# Patient Record
Sex: Female | Born: 1935 | Race: White | Hispanic: No | Marital: Married | Smoking: Never smoker
Health system: Southern US, Community
[De-identification: ages and names within clinical notes are randomized; demographics above are authoritative.]

## PROBLEM LIST (undated history)

## (undated) DIAGNOSIS — N289 Disorder of kidney and ureter, unspecified: Secondary | ICD-10-CM

## (undated) DIAGNOSIS — M810 Age-related osteoporosis without current pathological fracture: Secondary | ICD-10-CM

## (undated) HISTORY — PX: BREAST SURGERY: SHX581

## (undated) HISTORY — DX: Age-related osteoporosis without current pathological fracture: M81.0

## (undated) HISTORY — PX: THYROID SURGERY: SHX805

## (undated) HISTORY — PX: HYSTEROSCOPY: SHX211

## (undated) HISTORY — PX: KNEE SURGERY: SHX244

## (undated) HISTORY — DX: Disorder of kidney and ureter, unspecified: N28.9

---

## 1998-08-03 ENCOUNTER — Other Ambulatory Visit: Admission: RE | Admit: 1998-08-03 | Discharge: 1998-08-03 | Payer: Self-pay | Admitting: Gynecology

## 1998-10-14 ENCOUNTER — Other Ambulatory Visit: Admission: RE | Admit: 1998-10-14 | Discharge: 1998-10-14 | Payer: Self-pay | Admitting: Gynecology

## 1998-11-04 ENCOUNTER — Encounter (INDEPENDENT_AMBULATORY_CARE_PROVIDER_SITE_OTHER): Payer: Self-pay

## 1998-11-04 ENCOUNTER — Other Ambulatory Visit: Admission: RE | Admit: 1998-11-04 | Discharge: 1998-11-04 | Payer: Self-pay | Admitting: Gynecology

## 1999-01-13 ENCOUNTER — Encounter: Payer: Self-pay | Admitting: Internal Medicine

## 1999-01-13 ENCOUNTER — Encounter: Admission: RE | Admit: 1999-01-13 | Discharge: 1999-01-13 | Payer: Self-pay | Admitting: Internal Medicine

## 1999-01-21 ENCOUNTER — Encounter: Admission: RE | Admit: 1999-01-21 | Discharge: 1999-01-21 | Payer: Self-pay | Admitting: Internal Medicine

## 1999-01-21 ENCOUNTER — Encounter: Payer: Self-pay | Admitting: Internal Medicine

## 1999-09-08 ENCOUNTER — Encounter: Admission: RE | Admit: 1999-09-08 | Discharge: 1999-09-08 | Payer: Self-pay | Admitting: Gynecology

## 1999-09-08 ENCOUNTER — Encounter: Payer: Self-pay | Admitting: Gynecology

## 2000-02-01 ENCOUNTER — Encounter: Payer: Self-pay | Admitting: Gynecology

## 2000-02-01 ENCOUNTER — Encounter: Admission: RE | Admit: 2000-02-01 | Discharge: 2000-02-01 | Payer: Self-pay | Admitting: Gynecology

## 2000-09-11 ENCOUNTER — Other Ambulatory Visit: Admission: RE | Admit: 2000-09-11 | Discharge: 2000-09-11 | Payer: Self-pay | Admitting: Gynecology

## 2001-02-05 ENCOUNTER — Encounter: Admission: RE | Admit: 2001-02-05 | Discharge: 2001-02-05 | Payer: Self-pay | Admitting: Gynecology

## 2001-02-05 ENCOUNTER — Encounter: Payer: Self-pay | Admitting: Gynecology

## 2001-05-23 ENCOUNTER — Encounter: Admission: RE | Admit: 2001-05-23 | Discharge: 2001-05-23 | Payer: Self-pay | Admitting: *Deleted

## 2001-05-23 ENCOUNTER — Encounter: Payer: Self-pay | Admitting: *Deleted

## 2001-09-12 ENCOUNTER — Other Ambulatory Visit: Admission: RE | Admit: 2001-09-12 | Discharge: 2001-09-12 | Payer: Self-pay | Admitting: Gynecology

## 2002-02-06 ENCOUNTER — Encounter: Payer: Self-pay | Admitting: Gynecology

## 2002-02-06 ENCOUNTER — Encounter: Admission: RE | Admit: 2002-02-06 | Discharge: 2002-02-06 | Payer: Self-pay | Admitting: Gynecology

## 2002-09-17 ENCOUNTER — Other Ambulatory Visit: Admission: RE | Admit: 2002-09-17 | Discharge: 2002-09-17 | Payer: Self-pay | Admitting: Gynecology

## 2002-11-08 ENCOUNTER — Ambulatory Visit (HOSPITAL_COMMUNITY): Admission: RE | Admit: 2002-11-08 | Discharge: 2002-11-08 | Payer: Self-pay | Admitting: Internal Medicine

## 2002-11-08 ENCOUNTER — Encounter: Payer: Self-pay | Admitting: Internal Medicine

## 2003-02-17 ENCOUNTER — Encounter: Admission: RE | Admit: 2003-02-17 | Discharge: 2003-02-17 | Payer: Self-pay | Admitting: Gynecology

## 2003-09-18 ENCOUNTER — Other Ambulatory Visit: Admission: RE | Admit: 2003-09-18 | Discharge: 2003-09-18 | Payer: Self-pay | Admitting: Gynecology

## 2004-03-10 ENCOUNTER — Encounter: Admission: RE | Admit: 2004-03-10 | Discharge: 2004-03-10 | Payer: Self-pay | Admitting: Gynecology

## 2004-09-27 ENCOUNTER — Other Ambulatory Visit: Admission: RE | Admit: 2004-09-27 | Discharge: 2004-09-27 | Payer: Self-pay | Admitting: Gynecology

## 2004-09-28 ENCOUNTER — Ambulatory Visit (HOSPITAL_COMMUNITY): Admission: RE | Admit: 2004-09-28 | Discharge: 2004-09-28 | Payer: Self-pay | Admitting: Gastroenterology

## 2005-03-16 ENCOUNTER — Encounter: Admission: RE | Admit: 2005-03-16 | Discharge: 2005-03-16 | Payer: Self-pay | Admitting: Gynecology

## 2005-11-23 ENCOUNTER — Other Ambulatory Visit: Admission: RE | Admit: 2005-11-23 | Discharge: 2005-11-23 | Payer: Self-pay | Admitting: Gynecology

## 2005-11-23 ENCOUNTER — Encounter: Admission: RE | Admit: 2005-11-23 | Discharge: 2005-12-28 | Payer: Self-pay | Admitting: Orthopedic Surgery

## 2006-04-11 ENCOUNTER — Encounter: Admission: RE | Admit: 2006-04-11 | Discharge: 2006-04-11 | Payer: Self-pay | Admitting: Gynecology

## 2006-08-21 ENCOUNTER — Encounter: Admission: RE | Admit: 2006-08-21 | Discharge: 2006-08-21 | Payer: Self-pay | Admitting: Internal Medicine

## 2006-12-06 ENCOUNTER — Other Ambulatory Visit: Admission: RE | Admit: 2006-12-06 | Discharge: 2006-12-06 | Payer: Self-pay | Admitting: Gynecology

## 2007-04-16 ENCOUNTER — Encounter: Admission: RE | Admit: 2007-04-16 | Discharge: 2007-04-16 | Payer: Self-pay | Admitting: Gynecology

## 2008-04-24 ENCOUNTER — Encounter: Admission: RE | Admit: 2008-04-24 | Discharge: 2008-04-24 | Payer: Self-pay | Admitting: Gynecology

## 2009-04-27 ENCOUNTER — Encounter: Admission: RE | Admit: 2009-04-27 | Discharge: 2009-04-27 | Payer: Self-pay | Admitting: Gynecology

## 2010-07-27 ENCOUNTER — Other Ambulatory Visit: Payer: Self-pay | Admitting: Gynecology

## 2010-07-27 DIAGNOSIS — Z1231 Encounter for screening mammogram for malignant neoplasm of breast: Secondary | ICD-10-CM

## 2010-07-30 ENCOUNTER — Ambulatory Visit
Admission: RE | Admit: 2010-07-30 | Discharge: 2010-07-30 | Disposition: A | Discharge status: Home / Self Care | Payer: Medicare Other | Source: Ambulatory Visit | Attending: Gynecology | Admitting: Gynecology

## 2010-07-30 DIAGNOSIS — Z1231 Encounter for screening mammogram for malignant neoplasm of breast: Secondary | ICD-10-CM

## 2010-11-22 ENCOUNTER — Inpatient Hospital Stay (INDEPENDENT_AMBULATORY_CARE_PROVIDER_SITE_OTHER)
Admission: RE | Admit: 2010-11-22 | Discharge: 2010-11-22 | Disposition: A | Discharge status: Home / Self Care | Payer: Medicare Other | Source: Ambulatory Visit | Attending: Family Medicine | Admitting: Family Medicine

## 2010-11-22 DIAGNOSIS — S058X9A Other injuries of unspecified eye and orbit, initial encounter: Secondary | ICD-10-CM

## 2010-11-27 ENCOUNTER — Emergency Department (HOSPITAL_COMMUNITY)
Admission: EM | Admit: 2010-11-27 | Discharge: 2010-11-27 | Disposition: A | Discharge status: Home / Self Care | Payer: Medicare Other | Attending: Emergency Medicine | Admitting: Emergency Medicine

## 2010-11-27 DIAGNOSIS — S81009A Unspecified open wound, unspecified knee, initial encounter: Secondary | ICD-10-CM | POA: Insufficient documentation

## 2010-11-27 DIAGNOSIS — W268XXA Contact with other sharp object(s), not elsewhere classified, initial encounter: Secondary | ICD-10-CM | POA: Insufficient documentation

## 2010-11-27 DIAGNOSIS — Y92009 Unspecified place in unspecified non-institutional (private) residence as the place of occurrence of the external cause: Secondary | ICD-10-CM | POA: Insufficient documentation

## 2011-06-24 ENCOUNTER — Other Ambulatory Visit: Payer: Self-pay | Admitting: Gynecology

## 2011-06-24 DIAGNOSIS — Z1231 Encounter for screening mammogram for malignant neoplasm of breast: Secondary | ICD-10-CM

## 2011-08-01 ENCOUNTER — Ambulatory Visit
Admission: RE | Admit: 2011-08-01 | Discharge: 2011-08-01 | Disposition: A | Discharge status: Home / Self Care | Payer: Medicare Other | Source: Ambulatory Visit | Attending: Gynecology | Admitting: Gynecology

## 2011-08-01 DIAGNOSIS — Z1231 Encounter for screening mammogram for malignant neoplasm of breast: Secondary | ICD-10-CM

## 2012-09-12 ENCOUNTER — Other Ambulatory Visit: Payer: Self-pay

## 2012-09-12 DIAGNOSIS — Z1231 Encounter for screening mammogram for malignant neoplasm of breast: Secondary | ICD-10-CM

## 2012-09-19 ENCOUNTER — Ambulatory Visit
Admission: RE | Admit: 2012-09-19 | Discharge: 2012-09-19 | Disposition: A | Discharge status: Home / Self Care | Payer: Medicare Other | Source: Ambulatory Visit

## 2012-09-19 DIAGNOSIS — Z1231 Encounter for screening mammogram for malignant neoplasm of breast: Secondary | ICD-10-CM

## 2012-11-15 ENCOUNTER — Ambulatory Visit
Admission: RE | Admit: 2012-11-15 | Discharge: 2012-11-15 | Disposition: A | Discharge status: Home / Self Care | Payer: Medicare Other | Source: Ambulatory Visit | Attending: Internal Medicine | Admitting: Internal Medicine

## 2012-11-15 ENCOUNTER — Other Ambulatory Visit: Payer: Self-pay | Admitting: Internal Medicine

## 2012-11-15 DIAGNOSIS — M25561 Pain in right knee: Secondary | ICD-10-CM

## 2013-03-07 ENCOUNTER — Encounter: Payer: Self-pay | Admitting: Gynecology

## 2013-03-07 ENCOUNTER — Ambulatory Visit (INDEPENDENT_AMBULATORY_CARE_PROVIDER_SITE_OTHER): Payer: Medicare Other | Admitting: Gynecology

## 2013-03-07 VITALS — BP 120/76 | Ht 64.0 in | Wt 153.0 lb

## 2013-03-07 DIAGNOSIS — M8580 Other specified disorders of bone density and structure, unspecified site: Secondary | ICD-10-CM

## 2013-03-07 DIAGNOSIS — N952 Postmenopausal atrophic vaginitis: Secondary | ICD-10-CM

## 2013-03-07 DIAGNOSIS — N9489 Other specified conditions associated with female genital organs and menstrual cycle: Secondary | ICD-10-CM

## 2013-03-07 DIAGNOSIS — N8111 Cystocele, midline: Secondary | ICD-10-CM

## 2013-03-07 DIAGNOSIS — N898 Other specified noninflammatory disorders of vagina: Secondary | ICD-10-CM

## 2013-03-07 DIAGNOSIS — M899 Disorder of bone, unspecified: Secondary | ICD-10-CM

## 2013-03-07 DIAGNOSIS — N816 Rectocele: Secondary | ICD-10-CM

## 2013-03-07 DIAGNOSIS — K644 Residual hemorrhoidal skin tags: Secondary | ICD-10-CM

## 2013-03-07 MED ORDER — ESTRADIOL 0.1 MG/GM VA CREA: TOPICAL_CREAM | VAGINAL | Status: DC

## 2013-03-07 NOTE — Progress Notes (Signed)
Courtney Holland Sep 08, 1932 [id number]        79 y.o.  G1P0 new patient for followup exam.  Former patient of [name], several issues noted below.  Past medical history,surgical history, problem list, medications, allergies, family history and social history were all reviewed and documented in the EPIC chart.  ROS:  Performed and pertinent positives and negatives are included in the history, assessment and plan .  Exam: Kim assistant Filed Vitals:   03/07/13 1106  BP: 120/76  Height: 5\' 4"  (1.626 m)  Weight: 153 lb (69.4 kg)   General appearance  Normal Skin grossly normal Head/Neck normal with no cervical or supraclavicular adenopathy thyroid normal Lungs  clear Cardiac RR, without RMG Abdominal  soft, nontender, without masses, organomegaly or hernia Breasts  examined lying and sitting without masses, retractions, discharge or axillary adenopathy. Pelvic  Ext/BUS/vagina general atrophic changes. First to second-degree cystocele. First to second-degree rectocele. Cervix well supported.   Cervix  atrophic  Uterus  anteverted, normal size, shape and contour, midline and mobile nontender   Adnexa  Without masses or tenderness    Anus and perineum  Normal   Rectovaginal  Normal sphincter tone without palpated masses or tenderness. External hemorrhoids   Assessment/Plan:  79 y.o. G1P0 female for followup exam.   1. Postmenopausal/atrophic genital changes. Patient without significant hot flushes or night sweats. She does have vaginal dryness and some dyspareunia. Had been using Estrace vaginal cream per [name]. Reviewed issues of vaginal estrogen and potential for absorption. Risks include stroke heart attack DVT breast cancer. Patient's comfortable continuing I refilled her x1 year. Patient knows to report any vaginal bleeding. 2. Cystocele rectocele. Patient does remember [name] mentioning this. Patient is without symptoms such as pressure, fecal trapping or urinary symptoms  such as urgency or incontinence. Will observe at present. Followup if symptoms develop. Options for treatment were reviewed to include pessary and surgery. 3. Osteopenia. DEXA 2010 [name] office she is T score -2.2. Patient has never taken prescription medication for treatment. Is on extra vitamin D. Repeat DEXA now for baseline. Patient agrees to schedule. Increase calcium vitamin D reviewed. 4. External hemorrhoids, old. Patient asymptomatic and will continue to monitor. 5. Mammography 09/2012. Continue with annual mammography. SBE monthly reviewed. 6. Pap smear 2013. No Pap smear done today. No history of significant abnormal Pap smears. Current screening guidelines reviewed. Patient is comfortable stop screening as she is over the age of 46. 7. Colonoscopy 2006. Planned repeat at 10 year interval per her history. 8. Health maintenance. No blood work done as she actively sees Dr. Earl Gala. Followup for bone density otherwise annually.   Note: This document was prepared with digital dictation and possible smart phrase technology. Any transcriptional errors that result from this process are unintentional.   [name], 11:42 AM 03/07/2013

## 2013-03-07 NOTE — Patient Instructions (Signed)
Follow up for bone density as scheduled  Follow up for annual exam in one year 

## 2013-03-08 LAB — URINALYSIS W MICROSCOPIC + REFLEX CULTURE
Bilirubin Urine: NEGATIVE
Crystals: NONE SEEN
Glucose, UA: NEGATIVE mg/dL
Protein, ur: NEGATIVE mg/dL
Squamous Epithelial / LPF: NONE SEEN

## 2013-05-19 DIAGNOSIS — M810 Age-related osteoporosis without current pathological fracture: Secondary | ICD-10-CM | POA: Insufficient documentation

## 2013-05-19 HISTORY — DX: Age-related osteoporosis without current pathological fracture: M81.0

## 2013-05-30 ENCOUNTER — Ambulatory Visit (INDEPENDENT_AMBULATORY_CARE_PROVIDER_SITE_OTHER): Payer: Medicare Other

## 2013-05-30 ENCOUNTER — Telehealth: Payer: Self-pay | Admitting: Gynecology

## 2013-05-30 ENCOUNTER — Encounter: Payer: Self-pay | Admitting: Gynecology

## 2013-05-30 ENCOUNTER — Other Ambulatory Visit: Payer: Self-pay | Admitting: Gynecology

## 2013-05-30 DIAGNOSIS — M8580 Other specified disorders of bone density and structure, unspecified site: Secondary | ICD-10-CM

## 2013-05-30 DIAGNOSIS — M810 Age-related osteoporosis without current pathological fracture: Secondary | ICD-10-CM

## 2013-05-30 NOTE — Telephone Encounter (Signed)
Tell patient that her bone density does show osteoporosis and I recommend office visit to discuss treatment options

## 2013-05-31 NOTE — Telephone Encounter (Signed)
Pt informed with the below note. 

## 2013-06-10 ENCOUNTER — Ambulatory Visit: Payer: Medicare Other | Admitting: Gynecology

## 2013-06-20 ENCOUNTER — Ambulatory Visit: Payer: Medicare Other | Admitting: Gynecology

## 2013-07-09 ENCOUNTER — Ambulatory Visit: Payer: Medicare Other | Admitting: Gynecology

## 2013-07-16 ENCOUNTER — Encounter: Payer: Self-pay | Admitting: Gynecology

## 2013-07-16 ENCOUNTER — Ambulatory Visit (INDEPENDENT_AMBULATORY_CARE_PROVIDER_SITE_OTHER): Payer: Medicare Other | Admitting: Gynecology

## 2013-07-16 DIAGNOSIS — M810 Age-related osteoporosis without current pathological fracture: Secondary | ICD-10-CM

## 2013-07-16 NOTE — Progress Notes (Signed)
Courtney Holland Jun 25, 1932 [id number]        80 y.o.  G1P0 presents to discuss her bone density. Prior DEXA at Dr. Nicholas Holland office showed a T score -2.2. DEXA 05/2013 showed T score -2.5 right femoral neck. No history of prior treatment.  Past medical history,surgical history, problem list, medications, allergies, family history and social history were all reviewed and documented in the EPIC chart.  Directed ROS with pertinent positives and negatives documented in the history of present illness/assessment and plan.   Assessment/Plan:  80 y.o. G1P0 reviewed osteoporosis with the patient and risk of fracture. No history of prior treatment. Treatment options reviewed. My recommendation would be to consider starting Fosamax 70 mg weekly. How to take the medication and the side effects/risks reviewed to include GERD, osteonecrosis of the jaw, atypical fractures. Patient does relate having a history of renal disease most recently exacerbated by dehydration. Has a followup appointment with her nephrologist this coming month. I discussed with her that I would like to review the use of Fosamax in renal disease to see if contraindicated/lower dose recommended. Regardless I want her to discuss this with her nephrologist and get their approval before starting. Will get back with the patient in reference to this but she would be amenable to starting if we decide to do so.   Note: This document was prepared with digital dictation and possible smart phrase technology. Any transcriptional errors that result from this process are unintentional.   Courtney Lordsimothy P Kainoah Bartosiewicz MD, 12:28 PM 07/16/2013

## 2013-07-16 NOTE — Patient Instructions (Signed)
Office will contact you in reference to starting Fosamax. Recommend discussing this also with your kidney doctor.

## 2014-01-20 ENCOUNTER — Encounter: Payer: Self-pay | Admitting: Gynecology

## 2014-02-19 ENCOUNTER — Other Ambulatory Visit: Payer: Self-pay | Admitting: Internal Medicine

## 2014-02-19 DIAGNOSIS — Z1231 Encounter for screening mammogram for malignant neoplasm of breast: Secondary | ICD-10-CM

## 2014-03-10 ENCOUNTER — Encounter: Payer: Medicare Other | Admitting: Gynecology

## 2014-03-19 ENCOUNTER — Ambulatory Visit
Admission: RE | Admit: 2014-03-19 | Discharge: 2014-03-19 | Disposition: A | Discharge status: Home / Self Care | Payer: Medicare Other | Source: Ambulatory Visit | Attending: Internal Medicine | Admitting: Internal Medicine

## 2014-03-19 DIAGNOSIS — Z1231 Encounter for screening mammogram for malignant neoplasm of breast: Secondary | ICD-10-CM

## 2014-03-26 ENCOUNTER — Ambulatory Visit (INDEPENDENT_AMBULATORY_CARE_PROVIDER_SITE_OTHER): Payer: Medicare Other | Admitting: Gynecology

## 2014-03-26 ENCOUNTER — Encounter: Payer: Self-pay | Admitting: Gynecology

## 2014-03-26 VITALS — BP 136/80 | Ht 65.0 in | Wt 146.0 lb

## 2014-03-26 DIAGNOSIS — Z01419 Encounter for gynecological examination (general) (routine) without abnormal findings: Secondary | ICD-10-CM

## 2014-03-26 DIAGNOSIS — N8111 Cystocele, midline: Secondary | ICD-10-CM

## 2014-03-26 DIAGNOSIS — M810 Age-related osteoporosis without current pathological fracture: Secondary | ICD-10-CM

## 2014-03-26 DIAGNOSIS — N816 Rectocele: Secondary | ICD-10-CM

## 2014-03-26 DIAGNOSIS — N952 Postmenopausal atrophic vaginitis: Secondary | ICD-10-CM

## 2014-03-26 MED ORDER — ALENDRONATE SODIUM 70 MG PO TABS: 70.0000 mg | ORAL_TABLET | ORAL | Status: AC

## 2014-03-26 NOTE — Patient Instructions (Addendum)
Kegel Exercises The goal of Kegel exercises is to isolate and exercise your pelvic floor muscles. These muscles act as a hammock that supports the rectum, vagina, small intestine, and uterus. As the muscles weaken, the hammock sags and these organs are displaced from their normal positions. Kegel exercises can strengthen your pelvic floor muscles and help you to improve bladder and bowel control, improve sexual response, and help reduce many problems and some discomfort during pregnancy. Kegel exercises can be done anywhere and at any time. HOW TO PERFORM KEGEL EXERCISES 1. Locate your pelvic floor muscles. To do this, squeeze (contract) the muscles that you use when you try to stop the flow of urine. You will feel a tightness in the vaginal area (women) and a tight lift in the rectal area (men and women). 2. When you begin, contract your pelvic muscles tight for 2-5 seconds, then relax them for 2-5 seconds. This is one set. Do 4-5 sets with a short pause in between. 3. Contract your pelvic muscles for 8-10 seconds, then relax them for 8-10 seconds. Do 4-5 sets. If you cannot contract your pelvic muscles for 8-10 seconds, try 5-7 seconds and work your way up to 8-10 seconds. Your goal is 4-5 sets of 10 contractions each day. Keep your stomach, buttocks, and legs relaxed during the exercises. Perform sets of both short and long contractions. Vary your positions. Perform these contractions 3-4 times per day. Perform sets while you are:   Lying in bed in the morning.  Standing at lunch.  Sitting in the late afternoon.  Lying in bed at night. You should do 40-50 contractions per day. Do not perform more Kegel exercises per day than recommended. Overexercising can cause muscle fatigue. Continue these exercises for for at least 15-20 weeks or as directed by your caregiver. Document Released: 02/22/2012 Document Reviewed: 02/22/2012 [facility] Patient Information 2015 [place], [place]. This information is  not intended to replace advice given to you by your health care provider. Make sure you discuss any questions you have with your health care provider.          Start on the Fosamax as we discussed. Call me if you have any issues with this.  Alendronate tablets What is this medicine? ALENDRONATE (a LEN droe nate) slows calcium loss from bones. It helps to make normal healthy bone and to slow bone loss in people with Paget's disease and osteoporosis. It may be used in others at risk for bone loss. This medicine may be used for other purposes; ask your health care provider or pharmacist if you have questions. COMMON BRAND NAME(S): Fosamax What should I tell my health care provider before I take this medicine? They need to know if you have any of these conditions: -dental disease -esophagus, stomach, or intestine problems, like acid reflux or GERD -kidney disease -low blood calcium -low vitamin D -problems sitting or standing 30 minutes -trouble swallowing -an unusual or allergic reaction to alendronate, other medicines, foods, dyes, or preservatives -pregnant or trying to get pregnant -breast-feeding How should I use this medicine? You must take this medicine exactly as directed or you will lower the amount of the medicine you absorb into your body or you may cause yourself harm. Take this medicine by mouth first thing in the morning, after you are up for the day. Do not eat or drink anything before you take your medicine. Swallow the tablet with a full glass (6 to 8 fluid ounces) of plain water. Do not take  this medicine with any other drink. Do not chew or crush the tablet. After taking this medicine, do not eat breakfast, drink, or take any medicines or vitamins for at least 30 minutes. Sit or stand up for at least 30 minutes after you take this medicine; do not lie down. Do not take your medicine more often than directed. Talk to your pediatrician regarding the use of this medicine in  children. Special care may be needed. Overdosage: If you think you have taken too much of this medicine contact a poison control center or emergency room at once. NOTE: This medicine is only for you. Do not share this medicine with others. What if I miss a dose? If you miss a dose, do not take it later in the day. Continue your normal schedule starting the next morning. Do not take double or extra doses. What may interact with this medicine? -aluminum hydroxide -antacids -aspirin -calcium supplements -drugs for inflammation like ibuprofen, naproxen, and others -iron supplements -magnesium supplements -vitamins with minerals This list may not describe all possible interactions. Give your health care provider a list of all the medicines, herbs, non-prescription drugs, or dietary supplements you use. Also tell them if you smoke, drink alcohol, or use illegal drugs. Some items may interact with your medicine. What should I watch for while using this medicine? Visit your doctor or health care professional for regular checks ups. It may be some time before you see benefit from this medicine. Do not stop taking your medicine except on your doctor's advice. Your doctor or health care professional may order blood tests and other tests to see how you are doing. You should make sure you get enough calcium and vitamin D while you are taking this medicine, unless your doctor tells you not to. Discuss the foods you eat and the vitamins you take with your health care professional. Some people who take this medicine have severe bone, joint, and/or muscle pain. This medicine may also increase your risk for a broken thigh bone. Tell your doctor right away if you have pain in your upper leg or groin. Tell your doctor if you have any pain that does not go away or that gets worse. This medicine can make you more sensitive to the sun. If you get a rash while taking this medicine, sunlight may cause the rash to get  worse. Keep out of the sun. If you cannot avoid being in the sun, wear protective clothing and use sunscreen. Do not use sun lamps or tanning beds/booths. What side effects may I notice from receiving this medicine? Side effects that you should report to your doctor or health care professional as soon as possible: -allergic reactions like skin rash, itching or hives, swelling of the face, lips, or tongue -black or tarry stools -bone, muscle or joint pain -changes in vision -chest pain -heartburn or stomach pain -jaw pain, especially after dental work -pain or trouble when swallowing -redness, blistering, peeling or loosening of the skin, including inside the mouth Side effects that usually do not require medical attention (report to your doctor or health care professional if they continue or are bothersome): -changes in taste -diarrhea or constipation -eye pain or itching -headache -nausea or vomiting -stomach gas or fullness This list may not describe all possible side effects. Call your doctor for medical advice about side effects. You may report side effects to FDA at 1-800-FDA-1088. Where should I keep my medicine? Keep out of the reach of children. Store at  room temperature of 15 and 30 degrees C (59 and 86 degrees F). Throw away any unused medicine after the expiration date. NOTE: This sheet is a summary. It may not cover all possible information. If you have questions about this medicine, talk to your doctor, pharmacist, or health care provider.  2015, [employer]. (2010-09-03 08:56:09)

## 2014-03-26 NOTE — Progress Notes (Signed)
Larey SeatMarlene F Frison 1932/04/06 [id number]        81 y.o.  G1P0 for breast and pelvic exam. Several issues noted below.  Past medical history,surgical history, problem list, medications, allergies, family history and social history were all reviewed and documented as reviewed in the EPIC chart.  ROS:  Performed with pertinent positives and negatives included in the history, assessment and plan.   Additional significant findings :   None   Exam: [facility] personassistant Filed Vitals:   03/26/14 0758  BP: 136/80  Height: 5\' 5"  (1.651 m)  Weight: 146 lb (66.225 kg)   General appearance:  Normal affect, orientation and appearance. Skin: Grossly normal HEENT: Without gross lesions.  No cervical or supraclavicular adenopathy. Thyroid normal.  Lungs:  Clear without wheezing, rales or rhonchi Cardiac: RR, without RMG Abdominal:  Soft, nontender, without masses, guarding, rebound, organomegaly or hernia Breasts:  Examined lying and sitting without masses, retractions, discharge or axillary adenopathy. Pelvic:  Ext/BUS/vagina with generalized atrophic changes. First degree cystocele. First to second-degree rectocele. Uterus appears well supported.  Cervix with atrophic changes  Uterus anteverted, normal size, shape and contour, midline and mobile nontender   Adnexa  Without masses or tenderness    Anus and perineum  Normal   Rectovaginal  Normal sphincter tone without palpated masses or tenderness.    Assessment/Plan:  81 y.o. G1P0 female for breast and pelvic exam.   1. Postmenopausal/atrophic genital changes. Without significant symptoms of hot flashes, night sweats, vaginal dryness or dyspareunia. No vaginal bleeding. Continue to monitor. Report any vaginal bleeding. 2. Cystocele/rectocele. Does note some bulging after bowel movement. Not overly bothersome to the patient. Options for management reviewed to include Kegel exercises, pessary, surgery. Patient wants to try the Kegel exercises. Will follow  up if she wants to pursue alternative treatments. 3. Osteoporosis. DEXA 05/2013 T score -2.5. I discussed Fosamax previously with her. I was under the impression she was going to discuss this with her renal doctor but this did not happen. Brought in recent lab work which shows a normal BUN and creatinine as well as a normal calcium. Will go ahead and initiate Fosamax 70 mg weekly.  I again reviewed the side effects and risks to include GERD, osteonecrosis of the jaw atypical fractures. Patient will call me if she has any issues starting this and I also reviewed how to take the medication.  Symptoms she does well then we'll repeat her bone density in 2 years. She is supplementing calcium and vitamin D. 4. Mammography 02/2014. Continue with annual mammography. SBE monthly reviewed. 5. Pap smear 2013. No Pap smear done today. No history of significant abnormal Pap smears. We discussed current screening guidelines are both comfortable with stop screening. 6. Colonoscopy being arranged now. 7. Health maintenance. No routine blood work done as patient has had this all done recently. Follow up 1 year, sooner as needed.     Dara LordsFONTAINE,Mitul Hallowell P MD, 8:22 AM 03/26/2014

## 2014-10-21 ENCOUNTER — Ambulatory Visit
Admission: RE | Admit: 2014-10-21 | Discharge: 2014-10-21 | Disposition: A | Discharge status: Home / Self Care | Payer: Medicare Other | Source: Ambulatory Visit | Attending: Nurse Practitioner | Admitting: Nurse Practitioner

## 2014-10-21 ENCOUNTER — Other Ambulatory Visit: Payer: Self-pay | Admitting: Nurse Practitioner

## 2014-10-21 DIAGNOSIS — R509 Fever, unspecified: Secondary | ICD-10-CM

## 2014-10-25 ENCOUNTER — Emergency Department (HOSPITAL_COMMUNITY): Payer: Medicare Other

## 2014-10-25 ENCOUNTER — Emergency Department (HOSPITAL_COMMUNITY)
Admission: EM | Admit: 2014-10-25 | Discharge: 2014-10-25 | Disposition: A | Discharge status: Home / Self Care | Payer: Medicare Other | Attending: Emergency Medicine | Admitting: Emergency Medicine

## 2014-10-25 ENCOUNTER — Encounter (HOSPITAL_COMMUNITY): Payer: Self-pay | Admitting: Emergency Medicine

## 2014-10-25 DIAGNOSIS — Z792 Long term (current) use of antibiotics: Secondary | ICD-10-CM | POA: Insufficient documentation

## 2014-10-25 DIAGNOSIS — Z87448 Personal history of other diseases of urinary system: Secondary | ICD-10-CM | POA: Insufficient documentation

## 2014-10-25 DIAGNOSIS — X58XXXA Exposure to other specified factors, initial encounter: Secondary | ICD-10-CM | POA: Insufficient documentation

## 2014-10-25 DIAGNOSIS — Z79899 Other long term (current) drug therapy: Secondary | ICD-10-CM | POA: Insufficient documentation

## 2014-10-25 DIAGNOSIS — Y9289 Other specified places as the place of occurrence of the external cause: Secondary | ICD-10-CM | POA: Diagnosis not present

## 2014-10-25 DIAGNOSIS — Y939 Activity, unspecified: Secondary | ICD-10-CM | POA: Insufficient documentation

## 2014-10-25 DIAGNOSIS — M810 Age-related osteoporosis without current pathological fracture: Secondary | ICD-10-CM | POA: Insufficient documentation

## 2014-10-25 DIAGNOSIS — Z7982 Long term (current) use of aspirin: Secondary | ICD-10-CM | POA: Diagnosis not present

## 2014-10-25 DIAGNOSIS — Z791 Long term (current) use of non-steroidal anti-inflammatories (NSAID): Secondary | ICD-10-CM | POA: Diagnosis not present

## 2014-10-25 DIAGNOSIS — T368X1A Poisoning by other systemic antibiotics, accidental (unintentional), initial encounter: Secondary | ICD-10-CM | POA: Diagnosis present

## 2014-10-25 DIAGNOSIS — J189 Pneumonia, unspecified organism: Secondary | ICD-10-CM

## 2014-10-25 DIAGNOSIS — T50901A Poisoning by unspecified drugs, medicaments and biological substances, accidental (unintentional), initial encounter: Secondary | ICD-10-CM

## 2014-10-25 DIAGNOSIS — Y998 Other external cause status: Secondary | ICD-10-CM | POA: Insufficient documentation

## 2014-10-25 DIAGNOSIS — J159 Unspecified bacterial pneumonia: Secondary | ICD-10-CM | POA: Insufficient documentation

## 2014-10-25 LAB — CBC WITH DIFFERENTIAL/PLATELET
BASOS PCT: 2 % — AB (ref 0–1)
Basophils Absolute: 0.1 10*3/uL (ref 0.0–0.1)
EOS ABS: 0.1 10*3/uL (ref 0.0–0.7)
EOS PCT: 3 % (ref 0–5)
HEMATOCRIT: 32.7 % — AB (ref 36.0–46.0)
HEMOGLOBIN: 11.1 g/dL — AB (ref 12.0–15.0)
Lymphocytes Relative: 31 % (ref 12–46)
Lymphs Abs: 1.1 10*3/uL (ref 0.7–4.0)
MCH: 31.3 pg (ref 26.0–34.0)
MCHC: 33.9 g/dL (ref 30.0–36.0)
MCV: 92.1 fL (ref 78.0–100.0)
Monocytes Absolute: 0.5 10*3/uL (ref 0.1–1.0)
Monocytes Relative: 15 % — ABNORMAL HIGH (ref 3–12)
Neutro Abs: 1.7 10*3/uL (ref 1.7–7.7)
Neutrophils Relative %: 49 % (ref 43–77)
Platelets: 193 10*3/uL (ref 150–400)
RBC: 3.55 MIL/uL — ABNORMAL LOW (ref 3.87–5.11)
RDW: 13.3 % (ref 11.5–15.5)
WBC: 3.4 10*3/uL — AB (ref 4.0–10.5)

## 2014-10-25 LAB — BASIC METABOLIC PANEL
Anion gap: 11 (ref 5–15)
BUN: 12 mg/dL (ref 6–20)
CALCIUM: 9 mg/dL (ref 8.9–10.3)
CO2: 24 mmol/L (ref 22–32)
Chloride: 102 mmol/L (ref 101–111)
Creatinine, Ser: 1.09 mg/dL — ABNORMAL HIGH (ref 0.44–1.00)
GFR calc non Af Amer: 47 mL/min — ABNORMAL LOW (ref >60)
GFR, EST AFRICAN AMERICAN: 54 mL/min — AB (ref >60)
GLUCOSE: 97 mg/dL (ref 65–99)
Potassium: 4.6 mmol/L (ref 3.5–5.1)
SODIUM: 137 mmol/L (ref 135–145)

## 2014-10-25 MED ORDER — IOHEXOL 300 MG/ML  SOLN
80.0000 mL | Freq: Once | INTRAMUSCULAR | Status: AC | PRN
  Administered 2014-10-25: 80 mL via INTRAVENOUS

## 2014-10-25 MED ORDER — DOXYCYCLINE HYCLATE 100 MG PO CAPS: 100.0000 mg | ORAL_CAPSULE | Freq: Two times a day (BID) | ORAL | Status: AC

## 2014-10-25 MED ORDER — SODIUM CHLORIDE 0.9 % IV BOLUS (SEPSIS)
500.0000 mL | Freq: Once | INTRAVENOUS | Status: AC
  Administered 2014-10-25: 500 mL via INTRAVENOUS

## 2014-10-25 NOTE — ED Notes (Signed)
Pt returns from CT.

## 2014-10-25 NOTE — ED Notes (Signed)
Phlebotomy at bedside.

## 2014-10-25 NOTE — ED Notes (Addendum)
Pt comes from home with c/o dryness of the mouth from taking too many antibiotics. Pt had been getting treated for pneumonia for the past week and has been on Levaquin. Pt took  of Levaquin at 6pm and  at 1230am. Pt thought she was taking tylenol, but she mistakenly took  of Levaquin. Pt denies chest pain, dizziness, weakness or SOB.

## 2014-10-25 NOTE — ED Provider Notes (Signed)
CSN: [id number]     Arrival date & time 10/25/14  0746 History   First MD Initiated Contact with Patient 10/25/14 [phone]     Chief Complaint  Patient presents with  . Ingestion     The history is provided by the patient. No language interpreter was used.   [name] presents for evaluation of accidental overdose.  She has been on levaquin for pneumonia since Tuesday, 500mg  po daily.  She took a dose on Friday at 6pm.  This morning at 1230 am she meant to take two tylenol but accidentally took two levaquin tablets.  She did not realize that until this morning.  She endorses feeling a dry mouth and feeling dehydration.  No chest pain, cough.  Fevers resolved two days ago.  Sxs are moderate and constant.    Past Medical History  Diagnosis Date  . Kidney disease   . Osteoporosis 05/2013     T score -2.5 right femoral neck   Past Surgical History  Procedure Laterality Date  . Thyroid surgery    . Breast surgery      Benign breast lump  . Hysteroscopy    . Knee surgery      Arthroscopic   Family History  Problem Relation Age of Onset  . Cancer Brother     Lung  . Cancer Maternal Grandmother     Colon cancer  . Cancer Son     Lung  . Alzheimer's disease Mother   . Alzheimer's disease Sister    History  Substance Use Topics  . Smoking status: Never Smoker   . Smokeless tobacco: Not on file  . Alcohol Use: No   OB History    Gravida Para Term Preterm AB TAB SAB Ectopic Multiple Living   1         0     Review of Systems  All other systems reviewed and are negative.     Allergies  Adhesive; Oxycodone; and Polysporin  Home Medications   Prior to Admission medications   Medication Sig Start Date End Date Taking? Authorizing Provider  acetaminophen (TYLENOL) 500 MG tablet Take 1,000 mg by mouth every 6 (six) hours as needed.   Yes Historical Provider, MD  doxepin (SINEQUAN) 50 MG capsule Take 50 mg by mouth.   Yes Historical Provider, MD  levofloxacin (LEVAQUIN) 500 MG  tablet Take 500 mg by mouth daily. 10/21/14  Yes Historical Provider, MD  alendronate (FOSAMAX) 70 MG tablet Take 1 tablet (70 mg total) by mouth every 7 (seven) days. Take with a full glass of water on an empty stomach. Patient not taking: Reported on 10/25/2014 03/26/14   Dara Lords, MD  aspirin 81 MG tablet Take 81 mg by mouth daily.    Historical Provider, MD  Calcium Carbonate-Vitamin D (CALCIUM + D PO) Take by mouth.    Historical Provider, MD  Cholecalciferol (VITAMIN D PO) Take by mouth.    Historical Provider, MD  doxycycline (VIBRAMYCIN) 100 MG capsule Take 1 capsule (100 mg total) by mouth 2 (two) times daily. 10/25/14   Tilden Fossa, MD  Glucosamine-Chondroitin (GLUCOSAMINE CHONDR COMPLEX PO) Take by mouth.    Historical Provider, MD  meloxicam (MOBIC) 15 MG tablet Take 15 mg by mouth daily. 10/16/14   Historical Provider, MD  Multiple Vitamin (MULTIVITAMIN) tablet Take 1 tablet by mouth daily.    Historical Provider, MD  pantoprazole (PROTONIX) 40 MG tablet Take 40 mg by mouth daily.    Historical Provider,  MD  valACYclovir (VALTREX) 500 MG tablet Take 500 mg by mouth 2 (two) times daily.    Historical Provider, MD   BP 140/61 mmHg  Pulse 76  Temp(Src) 97.9 F (36.6 C) (Oral)  Resp 18  Ht 5\' 4"  (1.626 m)  Wt 144 lb (65.318 kg)  BMI 24.71 kg/m2  SpO2 99% Physical Exam  Constitutional: She is oriented to person, place, and time. She appears well-developed and well-nourished.  HENT:  Head: Normocephalic and atraumatic.  Cardiovascular: Normal rate and regular rhythm.   No murmur heard. Pulmonary/Chest: Effort normal. No respiratory distress.  Occasional crackle in left lung base  Abdominal: Soft. There is no tenderness. There is no rebound and no guarding.  Musculoskeletal: She exhibits no edema or tenderness.  Neurological: She is alert and oriented to person, place, and time.  Skin: Skin is warm and dry.  Psychiatric: She has a normal mood and affect. Her behavior is  normal.  Nursing note and vitals reviewed.   ED Course  Procedures (including critical care time) Labs Review Labs Reviewed  BASIC METABOLIC PANEL - Abnormal; Notable for the following:    Creatinine, Ser 1.09 (*)    GFR calc non Af Amer 47 (*)    GFR calc Af Amer 54 (*)    All other components within normal limits  CBC WITH DIFFERENTIAL/PLATELET - Abnormal; Notable for the following:    WBC 3.4 (*)    RBC 3.55 (*)    Hemoglobin 11.1 (*)    HCT 32.7 (*)    Monocytes Relative 15 (*)    Basophils Relative 2 (*)    All other components within normal limits    Imaging Review Dg Chest 2 View  10/25/2014   CLINICAL DATA:  81 year old female with 5 day history of cough and fever for 1 week. Recently prescribed antibiotics 1 week ago, with no improvement in symptoms.  EXAM: CHEST  2 VIEW  COMPARISON:  Chest x-ray 10/21/2014.  FINDINGS: Persistent right hilar prominence, with persistent opacity in the right mid lung, concerning for potential right hilar mass and postobstructive pneumonia in the inferior aspect of the right upper lobe. Left lung is clear. No pleural effusions. No evidence of pulmonary edema. Heart size is normal. Atherosclerosis in the thoracic aorta.  IMPRESSION: 1. Appearance of the chest is highly concerning for potential postobstructive pneumonia, and further evaluation with CT of the thorax (preferably with IV contrast) is recommended at this time to better evaluate this finding.   Electronically Signed   By: Trudie Reed M.D.   On: 10/25/2014 08:50   Ct Chest W Contrast  10/25/2014   CLINICAL DATA:  81 year old female with history of pneumonia, cough and fever.  EXAM: CT CHEST WITH CONTRAST  TECHNIQUE: Multidetector CT imaging of the chest was performed during intravenous contrast administration.  CONTRAST:  80mL OMNIPAQUE IOHEXOL 300 MG/ML  SOLN  COMPARISON:  Chest x-ray 10/25/2014.  FINDINGS: Mediastinum/Lymph Nodes: Heart size is normal. Small amount of pericardial fluid  and/or thickening anteriorly, unlikely to be of any hemodynamic significance at this time. No associated pericardial calcification. There multiple borderline enlarged right paratracheal lymph nodes measuring up to 8 mm in short axis. Multiple enlarged right hilar lymph nodes are noted, including a conglomeration of lymph nodes in the right hilar region measuring up to 2.0 x 1.3 cm (image 25 of series 2). Esophagus is unremarkable in appearance. No axillary lymphadenopathy.  Lungs/Pleura: Patchy areas of airspace consolidation are noted throughout the inferior aspect of  the right lower lobe where there arm is a combination of dense consolidation, ground-glass attenuation, and multiple air bronchograms. No definite suspicious appearing pulmonary nodule identified at this time. Left lung is clear. No pleural effusions. No definite endobronchial lesion identified.  Upper Abdomen: Unremarkable.  Musculoskeletal/Soft Tissues: There are no aggressive appearing lytic or blastic lesions noted in the visualized portions of the skeleton.  IMPRESSION: 1. The appearance the chest is most compatible with a right upper lobe pneumonia, with associated reactive right hilar and right paratracheal lymphadenopathy. The possibility of a central malignancy such as a small cell carcinoma is not entirely excluded, but is not strongly favored at this time. Continued attention with follow-up chest x-rays is recommended to ensure resolution of these findings. Should these findings fail to resolve or progressively worsened, repeat evaluation with contrast-enhanced chest CT would be recommended at that time. 2. Small amount of pericardial fluid and/or thickening, unlikely to be of any hemodynamic significance at this time.   Electronically Signed   By: Trudie Reed M.D.   On: 10/25/2014 11:01     EKG Interpretation   Date/Time:  Saturday October 25 2014 08:03:30 EDT Ventricular Rate:  82 PR Interval:  161 QRS Duration: 101 QT  Interval:  361 QTC Calculation: 422 R Axis:   -4 Text Interpretation:  Sinus rhythm Multiform ventricular premature  complexes Probable left atrial enlargement Low voltage, precordial leads  Anteroseptal infarct, age indeterminate Confirmed by Lincoln Brigham [phone]) on  10/25/2014 8:22:37 AM      MDM   Final diagnoses:  CAP (community acquired pneumonia)  Accidental overdose, initial encounter    Patient here for evaluation following an accidental overdose of Levaquin. No evidence of toxicity in the emergency department. Patient did have some cough and crackles on examination, imaging demonstrated pneumonia and recommended CT scan to evaluate for post obstructive pneumonia. CT scan demonstrates pneumonia but does not show evidence of mass. Patient feeling improved on recheck. Discussed home care for pneumonia, will switch antibiotics to doxycycline given her persistent pneumonia. Discussed close return precautions as well as need for repeat imaging to evaluate for resolution of her symptoms.    Tilden Fossa, MD 10/26/14 [phone]

## 2014-10-25 NOTE — Discharge Instructions (Signed)
Do not take any more Levaquin.  Start taking the Doxycycline on Sunday morning.  Get rechecked immediately if you develop difficulty breathing or new concerning symptoms.  Discuss with your Doctor that you had a CT scan of your chest in the Emergency Department today - it showed a pneumonia.  You will need additional imaging of your chest in the future.     Pneumonia Pneumonia is an infection of the lungs.  CAUSES Pneumonia may be caused by bacteria or a virus. Usually, these infections are caused by breathing infectious particles into the lungs (respiratory tract). SIGNS AND SYMPTOMS   Cough.  Fever.  Chest pain.  Increased rate of breathing.  Wheezing.  Mucus production. DIAGNOSIS  If you have the common symptoms of pneumonia, your health care provider will typically confirm the diagnosis with a chest X-ray. The X-ray will show an abnormality in the lung (pulmonary infiltrate) if you have pneumonia. Other tests of your blood, urine, or sputum may be done to find the specific cause of your pneumonia. Your health care provider may also do tests (blood gases or pulse oximetry) to see how well your lungs are working. TREATMENT  Some forms of pneumonia may be spread to other people when you cough or sneeze. You may be asked to wear a mask before and during your exam. Pneumonia that is caused by bacteria is treated with antibiotic medicine. Pneumonia that is caused by the influenza virus may be treated with an antiviral medicine. Most other viral infections must run their course. These infections will not respond to antibiotics.  HOME CARE INSTRUCTIONS   Cough suppressants may be used if you are losing too much rest. However, coughing protects you by clearing your lungs. You should avoid using cough suppressants if you can.  Your health care provider may have prescribed medicine if he or she thinks your pneumonia is caused by bacteria or influenza. Finish your medicine even if you start to  feel better.  Your health care provider may also prescribe an expectorant. This loosens the mucus to be coughed up.  Take medicines only as directed by your health care provider.  Do not smoke. Smoking is a common cause of bronchitis and can contribute to pneumonia. If you are a smoker and continue to smoke, your cough may last several weeks after your pneumonia has cleared.  A cold steam vaporizer or humidifier in your room or home may help loosen mucus.  Coughing is often worse at night. Sleeping in a semi-upright position in a recliner or using a couple pillows under your head will help with this.  Get rest as you feel it is needed. Your body will usually let you know when you need to rest. PREVENTION A pneumococcal shot (vaccine) is available to prevent a common bacterial cause of pneumonia. This is usually suggested for:  People over 85 years old.  Patients on chemotherapy.  People with chronic lung problems, such as bronchitis or emphysema.  People with immune system problems. If you are over 65 or have a high risk condition, you may receive the pneumococcal vaccine if you have not received it before. In some countries, a routine influenza vaccine is also recommended. This vaccine can help prevent some cases of pneumonia.You may be offered the influenza vaccine as part of your care. If you smoke, it is time to quit. You may receive instructions on how to stop smoking. Your health care provider can provide medicines and counseling to help you quit. SEEK MEDICAL  CARE IF: You have a fever. SEEK IMMEDIATE MEDICAL CARE IF:   Your illness becomes worse. This is especially true if you are elderly or weakened from any other disease.  You cannot control your cough with suppressants and are losing sleep.  You begin coughing up blood.  You develop pain which is getting worse or is uncontrolled with medicines.  Any of the symptoms which initially brought you in for treatment are  getting worse rather than better.  You develop shortness of breath or chest pain. MAKE SURE YOU:   Understand these instructions.  Will watch your condition.  Will get help right away if you are not doing well or get worse. Document Released: 03/07/2005 Document Revised: 07/22/2013 Document Reviewed: 05/27/2010 [facility] Patient Information 2015 [place], [place]. This information is not intended to replace advice given to you by your health care provider. Make sure you discuss any questions you have with your health care provider.

## 2014-10-25 NOTE — ED Notes (Signed)
Notified poison control center. Recommended lab work-CBC, BMP, EKG, and chest xray. Will follow up with [facility] with any changes of pt. Pt will continue to be monitored.

## 2014-12-09 ENCOUNTER — Ambulatory Visit
Admission: RE | Admit: 2014-12-09 | Discharge: 2014-12-09 | Disposition: A | Discharge status: Home / Self Care | Payer: Medicare Other | Source: Ambulatory Visit | Attending: Nurse Practitioner | Admitting: Nurse Practitioner

## 2014-12-09 ENCOUNTER — Other Ambulatory Visit: Payer: Self-pay | Admitting: Nurse Practitioner

## 2014-12-09 DIAGNOSIS — J189 Pneumonia, unspecified organism: Secondary | ICD-10-CM

## 2015-02-16 ENCOUNTER — Other Ambulatory Visit: Payer: Self-pay

## 2015-02-16 DIAGNOSIS — Z1231 Encounter for screening mammogram for malignant neoplasm of breast: Secondary | ICD-10-CM

## 2015-03-25 ENCOUNTER — Ambulatory Visit: Payer: Self-pay

## 2015-04-13 ENCOUNTER — Ambulatory Visit
Admission: RE | Admit: 2015-04-13 | Discharge: 2015-04-13 | Disposition: A | Discharge status: Home / Self Care | Payer: Medicare Other | Source: Ambulatory Visit

## 2015-04-13 DIAGNOSIS — Z1231 Encounter for screening mammogram for malignant neoplasm of breast: Secondary | ICD-10-CM

## 2015-04-22 ENCOUNTER — Encounter: Payer: Medicare Other | Admitting: Gynecology

## 2015-05-13 DIAGNOSIS — N183 Chronic kidney disease, stage 3 (moderate): Secondary | ICD-10-CM | POA: Diagnosis not present

## 2015-05-13 DIAGNOSIS — E559 Vitamin D deficiency, unspecified: Secondary | ICD-10-CM | POA: Diagnosis not present

## 2015-05-13 DIAGNOSIS — N189 Chronic kidney disease, unspecified: Secondary | ICD-10-CM | POA: Diagnosis not present

## 2015-05-20 ENCOUNTER — Other Ambulatory Visit: Payer: Self-pay | Admitting: Nephrology

## 2015-05-20 DIAGNOSIS — N183 Chronic kidney disease, stage 3 (moderate): Secondary | ICD-10-CM | POA: Diagnosis not present

## 2015-05-20 DIAGNOSIS — E559 Vitamin D deficiency, unspecified: Secondary | ICD-10-CM | POA: Diagnosis not present

## 2015-05-20 DIAGNOSIS — N281 Cyst of kidney, acquired: Secondary | ICD-10-CM

## 2015-05-20 DIAGNOSIS — D631 Anemia in chronic kidney disease: Secondary | ICD-10-CM | POA: Diagnosis not present

## 2015-05-20 DIAGNOSIS — I10 Essential (primary) hypertension: Secondary | ICD-10-CM | POA: Diagnosis not present

## 2015-05-21 ENCOUNTER — Ambulatory Visit
Admission: RE | Admit: 2015-05-21 | Discharge: 2015-05-21 | Disposition: A | Discharge status: Home / Self Care | Payer: Medicare Other | Source: Ambulatory Visit | Attending: Nephrology | Admitting: Nephrology

## 2015-05-21 DIAGNOSIS — N183 Chronic kidney disease, stage 3 (moderate): Secondary | ICD-10-CM

## 2015-05-21 DIAGNOSIS — N281 Cyst of kidney, acquired: Secondary | ICD-10-CM

## 2015-05-21 DIAGNOSIS — L280 Lichen simplex chronicus: Secondary | ICD-10-CM | POA: Diagnosis not present

## 2015-05-22 ENCOUNTER — Other Ambulatory Visit: Payer: Self-pay

## 2015-10-05 DIAGNOSIS — L821 Other seborrheic keratosis: Secondary | ICD-10-CM | POA: Diagnosis not present

## 2015-10-05 DIAGNOSIS — C44619 Basal cell carcinoma of skin of left upper limb, including shoulder: Secondary | ICD-10-CM | POA: Diagnosis not present

## 2015-10-05 DIAGNOSIS — L814 Other melanin hyperpigmentation: Secondary | ICD-10-CM | POA: Diagnosis not present

## 2015-10-05 DIAGNOSIS — L570 Actinic keratosis: Secondary | ICD-10-CM | POA: Diagnosis not present

## 2015-10-05 DIAGNOSIS — D225 Melanocytic nevi of trunk: Secondary | ICD-10-CM | POA: Diagnosis not present

## 2015-10-05 DIAGNOSIS — Z85828 Personal history of other malignant neoplasm of skin: Secondary | ICD-10-CM | POA: Diagnosis not present

## 2015-11-26 DIAGNOSIS — G8929 Other chronic pain: Secondary | ICD-10-CM | POA: Diagnosis not present

## 2015-11-26 DIAGNOSIS — M25561 Pain in right knee: Secondary | ICD-10-CM | POA: Diagnosis not present

## 2015-11-26 DIAGNOSIS — Z23 Encounter for immunization: Secondary | ICD-10-CM | POA: Diagnosis not present

## 2015-12-30 DIAGNOSIS — J069 Acute upper respiratory infection, unspecified: Secondary | ICD-10-CM | POA: Diagnosis not present

## 2016-01-12 DIAGNOSIS — R030 Elevated blood-pressure reading, without diagnosis of hypertension: Secondary | ICD-10-CM | POA: Diagnosis not present

## 2016-01-12 DIAGNOSIS — J0100 Acute maxillary sinusitis, unspecified: Secondary | ICD-10-CM | POA: Diagnosis not present

## 2016-02-15 DIAGNOSIS — M6281 Muscle weakness (generalized): Secondary | ICD-10-CM | POA: Diagnosis not present

## 2016-02-15 DIAGNOSIS — M25561 Pain in right knee: Secondary | ICD-10-CM | POA: Diagnosis not present

## 2016-02-18 DIAGNOSIS — M25561 Pain in right knee: Secondary | ICD-10-CM | POA: Diagnosis not present

## 2016-02-18 DIAGNOSIS — M6281 Muscle weakness (generalized): Secondary | ICD-10-CM | POA: Diagnosis not present

## 2016-02-22 DIAGNOSIS — M6281 Muscle weakness (generalized): Secondary | ICD-10-CM | POA: Diagnosis not present

## 2016-02-22 DIAGNOSIS — M25561 Pain in right knee: Secondary | ICD-10-CM | POA: Diagnosis not present

## 2016-02-25 DIAGNOSIS — M6281 Muscle weakness (generalized): Secondary | ICD-10-CM | POA: Diagnosis not present

## 2016-02-25 DIAGNOSIS — M25561 Pain in right knee: Secondary | ICD-10-CM | POA: Diagnosis not present

## 2016-02-29 DIAGNOSIS — M6281 Muscle weakness (generalized): Secondary | ICD-10-CM | POA: Diagnosis not present

## 2016-02-29 DIAGNOSIS — M25561 Pain in right knee: Secondary | ICD-10-CM | POA: Diagnosis not present

## 2016-03-02 DIAGNOSIS — M25561 Pain in right knee: Secondary | ICD-10-CM | POA: Diagnosis not present

## 2016-03-02 DIAGNOSIS — M6281 Muscle weakness (generalized): Secondary | ICD-10-CM | POA: Diagnosis not present

## 2016-03-03 DIAGNOSIS — M25561 Pain in right knee: Secondary | ICD-10-CM | POA: Diagnosis not present

## 2016-03-03 DIAGNOSIS — G8929 Other chronic pain: Secondary | ICD-10-CM | POA: Diagnosis not present

## 2016-03-07 DIAGNOSIS — M25561 Pain in right knee: Secondary | ICD-10-CM | POA: Diagnosis not present

## 2016-03-07 DIAGNOSIS — M6281 Muscle weakness (generalized): Secondary | ICD-10-CM | POA: Diagnosis not present

## 2016-03-11 ENCOUNTER — Other Ambulatory Visit: Payer: Self-pay | Admitting: Internal Medicine

## 2016-03-11 DIAGNOSIS — Z1231 Encounter for screening mammogram for malignant neoplasm of breast: Secondary | ICD-10-CM

## 2016-03-16 DIAGNOSIS — M25561 Pain in right knee: Secondary | ICD-10-CM | POA: Diagnosis not present

## 2016-03-16 DIAGNOSIS — M6281 Muscle weakness (generalized): Secondary | ICD-10-CM | POA: Diagnosis not present

## 2016-03-18 DIAGNOSIS — M25561 Pain in right knee: Secondary | ICD-10-CM | POA: Diagnosis not present

## 2016-03-18 DIAGNOSIS — M6281 Muscle weakness (generalized): Secondary | ICD-10-CM | POA: Diagnosis not present

## 2016-03-23 DIAGNOSIS — M1711 Unilateral primary osteoarthritis, right knee: Secondary | ICD-10-CM | POA: Diagnosis not present

## 2016-03-30 DIAGNOSIS — M1711 Unilateral primary osteoarthritis, right knee: Secondary | ICD-10-CM | POA: Diagnosis not present

## 2016-04-01 DIAGNOSIS — H16143 Punctate keratitis, bilateral: Secondary | ICD-10-CM | POA: Diagnosis not present

## 2016-04-01 DIAGNOSIS — H10413 Chronic giant papillary conjunctivitis, bilateral: Secondary | ICD-10-CM | POA: Diagnosis not present

## 2016-04-01 DIAGNOSIS — D3131 Benign neoplasm of right choroid: Secondary | ICD-10-CM | POA: Diagnosis not present

## 2016-04-01 DIAGNOSIS — Z961 Presence of intraocular lens: Secondary | ICD-10-CM | POA: Diagnosis not present

## 2016-04-01 DIAGNOSIS — H04123 Dry eye syndrome of bilateral lacrimal glands: Secondary | ICD-10-CM | POA: Diagnosis not present

## 2016-04-01 DIAGNOSIS — H40013 Open angle with borderline findings, low risk, bilateral: Secondary | ICD-10-CM | POA: Diagnosis not present

## 2016-04-11 DIAGNOSIS — M1711 Unilateral primary osteoarthritis, right knee: Secondary | ICD-10-CM | POA: Diagnosis not present

## 2016-04-18 ENCOUNTER — Ambulatory Visit
Admission: RE | Admit: 2016-04-18 | Discharge: 2016-04-18 | Disposition: A | Discharge status: Home / Self Care | Payer: Medicare Other | Source: Ambulatory Visit | Attending: Internal Medicine | Admitting: Internal Medicine

## 2016-04-18 DIAGNOSIS — Z1231 Encounter for screening mammogram for malignant neoplasm of breast: Secondary | ICD-10-CM

## 2016-04-20 DIAGNOSIS — M1711 Unilateral primary osteoarthritis, right knee: Secondary | ICD-10-CM | POA: Diagnosis not present

## 2016-04-22 DIAGNOSIS — N183 Chronic kidney disease, stage 3 (moderate): Secondary | ICD-10-CM | POA: Diagnosis not present

## 2016-04-22 DIAGNOSIS — K5909 Other constipation: Secondary | ICD-10-CM | POA: Diagnosis not present

## 2016-04-22 DIAGNOSIS — Z0000 Encounter for general adult medical examination without abnormal findings: Secondary | ICD-10-CM | POA: Diagnosis not present

## 2016-04-22 DIAGNOSIS — M810 Age-related osteoporosis without current pathological fracture: Secondary | ICD-10-CM | POA: Diagnosis not present

## 2016-04-27 DIAGNOSIS — M1711 Unilateral primary osteoarthritis, right knee: Secondary | ICD-10-CM | POA: Diagnosis not present

## 2016-05-03 IMAGING — CT CT CHEST W/ CM
2 of 4 series · 15 of 36 positions shown, 18 images · IV contrast (omnipaque)
Comparison: Chest x-ray 10/25/2014.

CLINICAL DATA: 82-year-old female with history of pneumonia, cough
and fever.

EXAM:
CT CHEST WITH CONTRAST
TECHNIQUE: Multidetector CT imaging of the chest was performed during
intravenous contrast administration.
CONTRAST:  80mL OMNIPAQUE IOHEXOL 300 MG/ML  SOLN

[Series 2: thorax 5.0 i31f 1 · axial · 0.63mm/px · z∈[+1054,+1304]mm · 12 of 60 slices shown, 15 images]
[im 5/60  mediastinal]
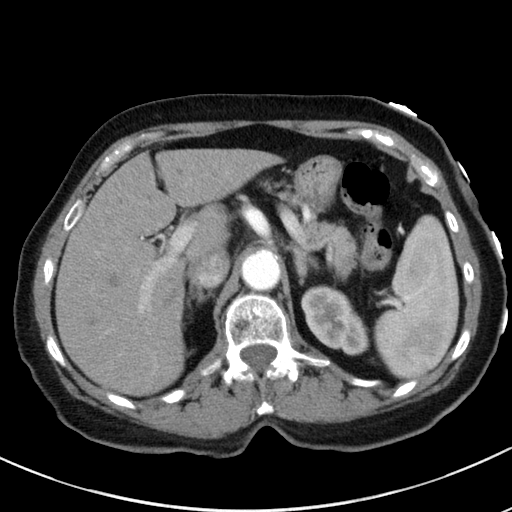
[im 5/60  lung]
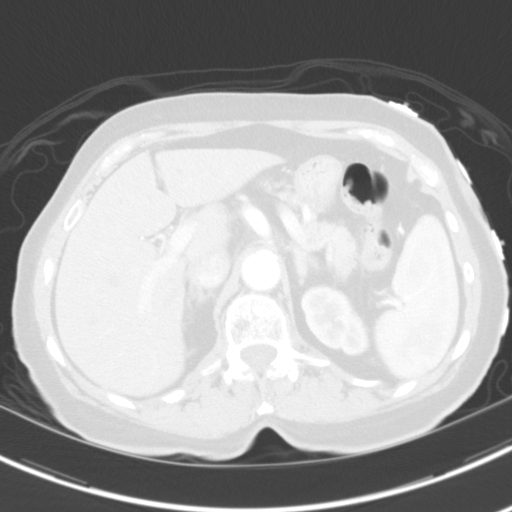
[im 9/60  lung]
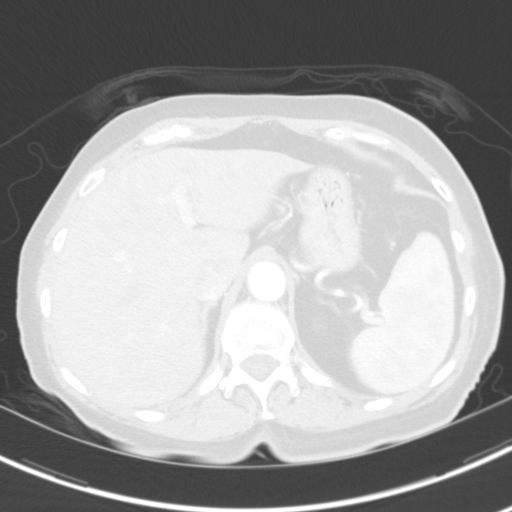
[im 13/60  lung]
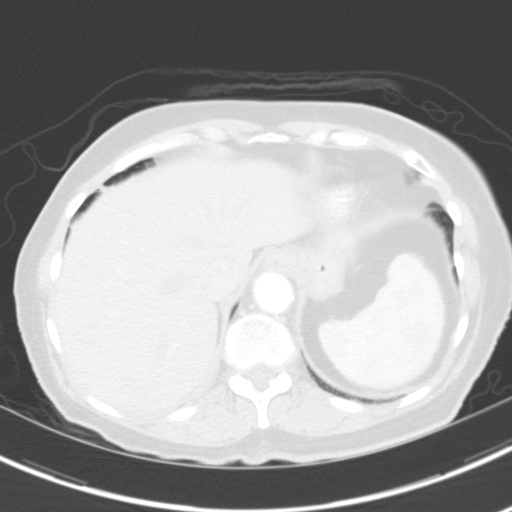
[im 17/60  lung]
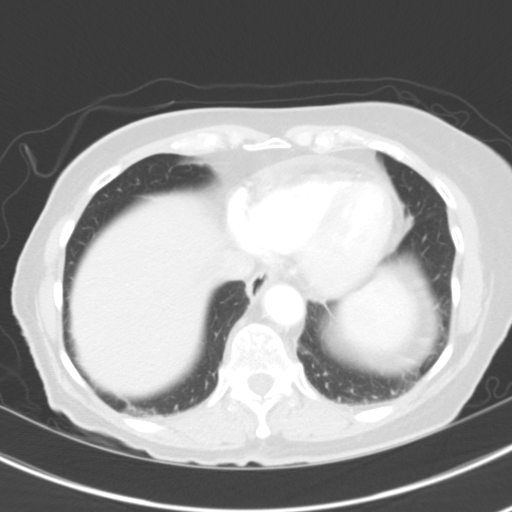
[im 22/60  mediastinal]
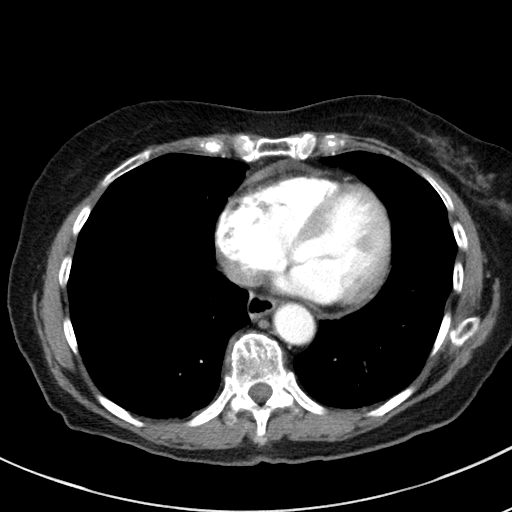
[im 22/60  lung]
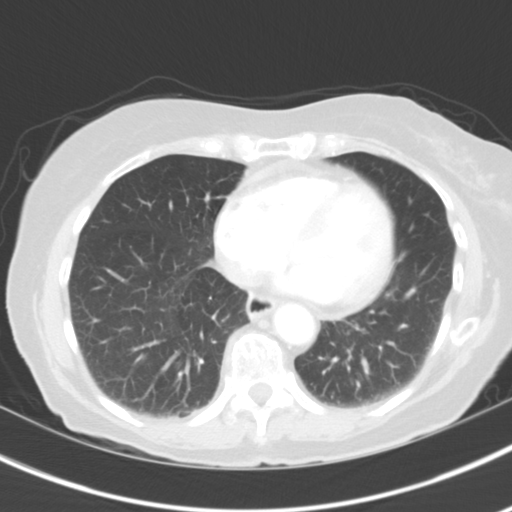
[im 26/60  lung]
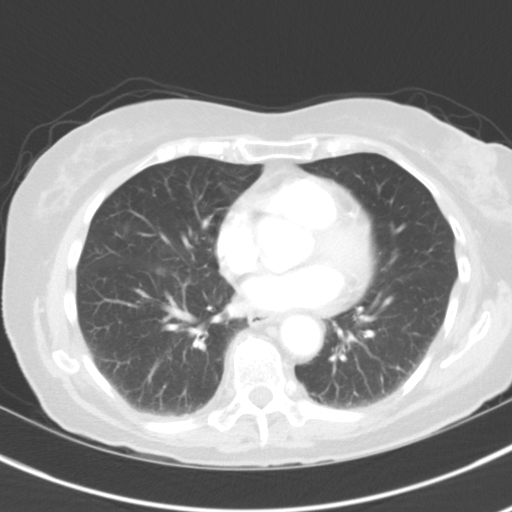
[im 34/60  lung]
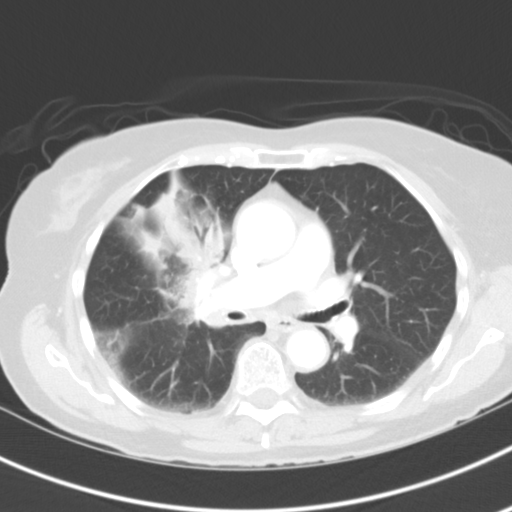
[im 38/60  lung]
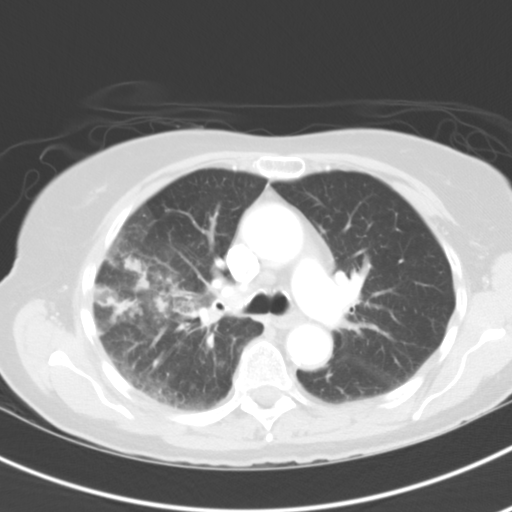
[im 43/60  mediastinal]
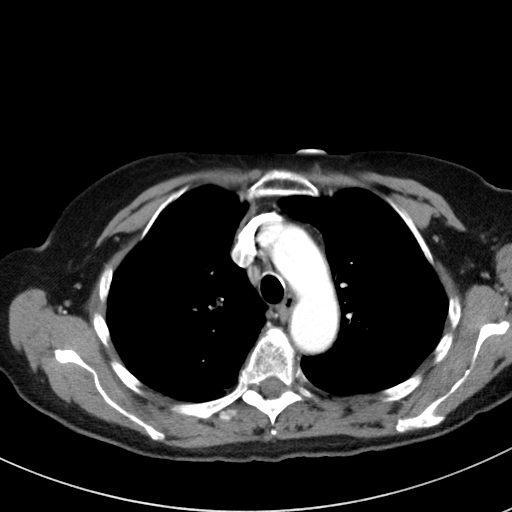
[im 43/60  lung]
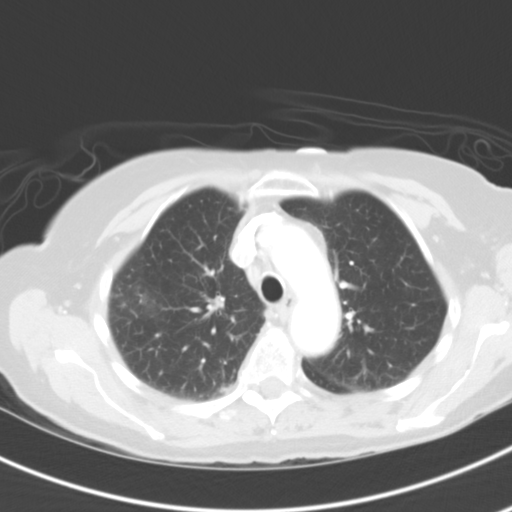
[im 47/60  lung]
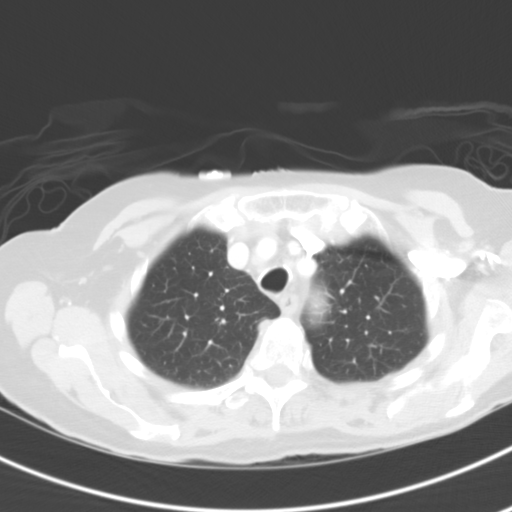
[im 51/60  lung]
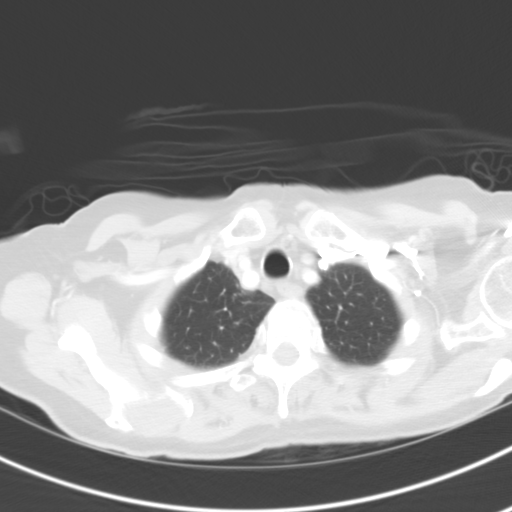
[im 55/60  lung]
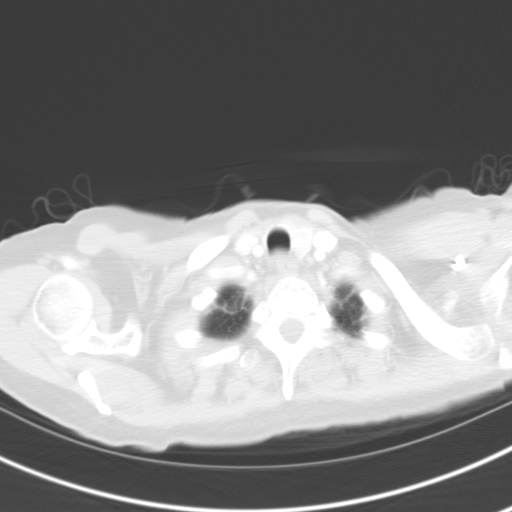

[Series 5: coronal · coronal · 0.59mm/px · 3 of 74 slices shown]
[im 15/74  lung]
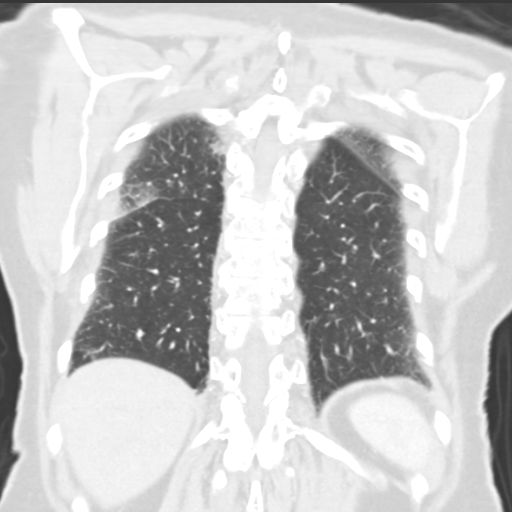
[im 30/74  lung]
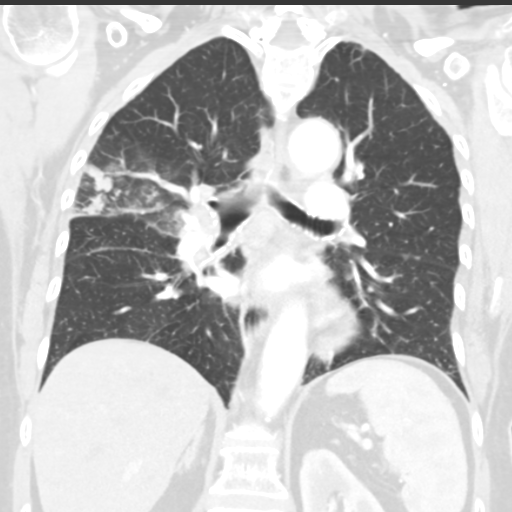
[im 44/74  lung]
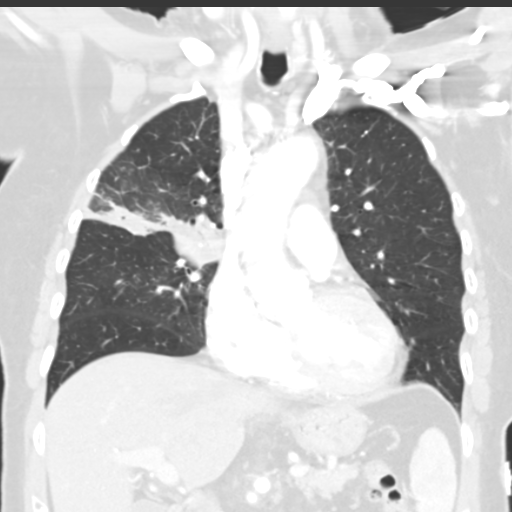

[15 of 36 positions shown; findings below may reference images not displayed]

FINDINGS: Mediastinum/Lymph Nodes: Heart size is normal. Small amount of
pericardial fluid and/or thickening anteriorly, unlikely to be of
any hemodynamic significance at this time. No associated pericardial
calcification. There multiple borderline enlarged right paratracheal
lymph nodes measuring up to 8 mm in short axis. Multiple enlarged
right hilar lymph nodes are noted, including a conglomeration of
lymph nodes in the right hilar region measuring up to 2.0 x 1.3 cm
(image 25 of series 2). Esophagus is unremarkable in appearance. No
axillary lymphadenopathy.

Lungs/Pleura: Patchy areas of airspace consolidation are noted
throughout the inferior aspect of the right lower lobe where there
arm is a combination of dense consolidation, ground-glass
attenuation, and multiple air bronchograms. No definite suspicious
appearing pulmonary nodule identified at this time. Left lung is
clear. No pleural effusions. No definite endobronchial lesion
identified.

Upper Abdomen: Unremarkable.

Musculoskeletal/Soft Tissues: There are no aggressive appearing
lytic or blastic lesions noted in the visualized portions of the
skeleton.
IMPRESSION: 1. The appearance the chest is most compatible with a right upper
lobe pneumonia, with associated reactive right hilar and right
paratracheal lymphadenopathy. The possibility of a central
malignancy such as a small cell carcinoma is not entirely excluded,
but is not strongly favored at this time. Continued attention with
follow-up chest x-rays is recommended to ensure resolution of these
findings. Should these findings fail to resolve or progressively
worsened, repeat evaluation with contrast-enhanced chest CT would be
recommended at that time.
2. Small amount of pericardial fluid and/or thickening, unlikely to
be of any hemodynamic significance at this time.

## 2016-06-08 DIAGNOSIS — M810 Age-related osteoporosis without current pathological fracture: Secondary | ICD-10-CM | POA: Diagnosis not present

## 2016-06-22 DIAGNOSIS — M810 Age-related osteoporosis without current pathological fracture: Secondary | ICD-10-CM | POA: Diagnosis not present

## 2016-10-10 DIAGNOSIS — L438 Other lichen planus: Secondary | ICD-10-CM | POA: Diagnosis not present

## 2016-10-10 DIAGNOSIS — L814 Other melanin hyperpigmentation: Secondary | ICD-10-CM | POA: Diagnosis not present

## 2016-10-10 DIAGNOSIS — C4441 Basal cell carcinoma of skin of scalp and neck: Secondary | ICD-10-CM | POA: Diagnosis not present

## 2016-10-10 DIAGNOSIS — L84 Corns and callosities: Secondary | ICD-10-CM | POA: Diagnosis not present

## 2016-10-10 DIAGNOSIS — Z85828 Personal history of other malignant neoplasm of skin: Secondary | ICD-10-CM | POA: Diagnosis not present

## 2016-12-07 DIAGNOSIS — N183 Chronic kidney disease, stage 3 (moderate): Secondary | ICD-10-CM | POA: Diagnosis not present

## 2016-12-07 DIAGNOSIS — E559 Vitamin D deficiency, unspecified: Secondary | ICD-10-CM | POA: Diagnosis not present

## 2016-12-07 DIAGNOSIS — N189 Chronic kidney disease, unspecified: Secondary | ICD-10-CM | POA: Diagnosis not present

## 2016-12-14 DIAGNOSIS — I10 Essential (primary) hypertension: Secondary | ICD-10-CM | POA: Diagnosis not present

## 2016-12-14 DIAGNOSIS — N183 Chronic kidney disease, stage 3 (moderate): Secondary | ICD-10-CM | POA: Diagnosis not present

## 2016-12-14 DIAGNOSIS — D631 Anemia in chronic kidney disease: Secondary | ICD-10-CM | POA: Diagnosis not present

## 2016-12-14 DIAGNOSIS — E559 Vitamin D deficiency, unspecified: Secondary | ICD-10-CM | POA: Diagnosis not present

## 2017-04-06 DIAGNOSIS — H40013 Open angle with borderline findings, low risk, bilateral: Secondary | ICD-10-CM | POA: Diagnosis not present

## 2017-04-06 DIAGNOSIS — D3131 Benign neoplasm of right choroid: Secondary | ICD-10-CM | POA: Diagnosis not present

## 2017-04-06 DIAGNOSIS — H04123 Dry eye syndrome of bilateral lacrimal glands: Secondary | ICD-10-CM | POA: Diagnosis not present

## 2017-04-06 DIAGNOSIS — Z961 Presence of intraocular lens: Secondary | ICD-10-CM | POA: Diagnosis not present

## 2017-04-26 ENCOUNTER — Other Ambulatory Visit: Payer: Self-pay | Admitting: Internal Medicine

## 2017-04-26 DIAGNOSIS — Z1389 Encounter for screening for other disorder: Secondary | ICD-10-CM | POA: Diagnosis not present

## 2017-04-26 DIAGNOSIS — Z1231 Encounter for screening mammogram for malignant neoplasm of breast: Secondary | ICD-10-CM

## 2017-04-26 DIAGNOSIS — K5909 Other constipation: Secondary | ICD-10-CM | POA: Diagnosis not present

## 2017-04-26 DIAGNOSIS — N183 Chronic kidney disease, stage 3 (moderate): Secondary | ICD-10-CM | POA: Diagnosis not present

## 2017-04-26 DIAGNOSIS — M810 Age-related osteoporosis without current pathological fracture: Secondary | ICD-10-CM | POA: Diagnosis not present

## 2017-05-11 ENCOUNTER — Ambulatory Visit
Admission: RE | Admit: 2017-05-11 | Discharge: 2017-05-11 | Disposition: A | Discharge status: Home / Self Care | Payer: Medicare Other | Source: Ambulatory Visit | Attending: Internal Medicine | Admitting: Internal Medicine

## 2017-05-11 DIAGNOSIS — Z1231 Encounter for screening mammogram for malignant neoplasm of breast: Secondary | ICD-10-CM | POA: Diagnosis not present

## 2017-08-01 DIAGNOSIS — J018 Other acute sinusitis: Secondary | ICD-10-CM | POA: Diagnosis not present

## 2017-09-10 DIAGNOSIS — J0100 Acute maxillary sinusitis, unspecified: Secondary | ICD-10-CM | POA: Diagnosis not present

## 2017-12-12 DIAGNOSIS — N183 Chronic kidney disease, stage 3 (moderate): Secondary | ICD-10-CM | POA: Diagnosis not present

## 2017-12-19 DIAGNOSIS — E559 Vitamin D deficiency, unspecified: Secondary | ICD-10-CM | POA: Diagnosis not present

## 2017-12-19 DIAGNOSIS — I129 Hypertensive chronic kidney disease with stage 1 through stage 4 chronic kidney disease, or unspecified chronic kidney disease: Secondary | ICD-10-CM | POA: Diagnosis not present

## 2017-12-19 DIAGNOSIS — N183 Chronic kidney disease, stage 3 (moderate): Secondary | ICD-10-CM | POA: Diagnosis not present

## 2017-12-19 DIAGNOSIS — Z23 Encounter for immunization: Secondary | ICD-10-CM | POA: Diagnosis not present

## 2018-01-08 DIAGNOSIS — L570 Actinic keratosis: Secondary | ICD-10-CM | POA: Diagnosis not present

## 2018-01-08 DIAGNOSIS — D225 Melanocytic nevi of trunk: Secondary | ICD-10-CM | POA: Diagnosis not present

## 2018-01-08 DIAGNOSIS — L821 Other seborrheic keratosis: Secondary | ICD-10-CM | POA: Diagnosis not present

## 2018-01-08 DIAGNOSIS — L818 Other specified disorders of pigmentation: Secondary | ICD-10-CM | POA: Diagnosis not present

## 2018-01-08 DIAGNOSIS — Z85828 Personal history of other malignant neoplasm of skin: Secondary | ICD-10-CM | POA: Diagnosis not present

## 2018-04-30 DIAGNOSIS — M810 Age-related osteoporosis without current pathological fracture: Secondary | ICD-10-CM | POA: Diagnosis not present

## 2018-04-30 DIAGNOSIS — L280 Lichen simplex chronicus: Secondary | ICD-10-CM | POA: Diagnosis not present

## 2018-04-30 DIAGNOSIS — G2581 Restless legs syndrome: Secondary | ICD-10-CM | POA: Diagnosis not present

## 2018-04-30 DIAGNOSIS — N183 Chronic kidney disease, stage 3 (moderate): Secondary | ICD-10-CM | POA: Diagnosis not present

## 2018-05-01 DIAGNOSIS — D3131 Benign neoplasm of right choroid: Secondary | ICD-10-CM | POA: Diagnosis not present

## 2018-05-01 DIAGNOSIS — H04123 Dry eye syndrome of bilateral lacrimal glands: Secondary | ICD-10-CM | POA: Diagnosis not present

## 2018-05-01 DIAGNOSIS — H43811 Vitreous degeneration, right eye: Secondary | ICD-10-CM | POA: Diagnosis not present

## 2018-05-01 DIAGNOSIS — H40013 Open angle with borderline findings, low risk, bilateral: Secondary | ICD-10-CM | POA: Diagnosis not present

## 2018-05-08 DIAGNOSIS — H524 Presbyopia: Secondary | ICD-10-CM | POA: Diagnosis not present

## 2018-10-09 DIAGNOSIS — Z0120 Encounter for dental examination and cleaning without abnormal findings: Secondary | ICD-10-CM | POA: Diagnosis not present

## 2018-11-28 DIAGNOSIS — L821 Other seborrheic keratosis: Secondary | ICD-10-CM | POA: Diagnosis not present

## 2018-11-28 DIAGNOSIS — Z85828 Personal history of other malignant neoplasm of skin: Secondary | ICD-10-CM | POA: Diagnosis not present

## 2018-11-28 DIAGNOSIS — L308 Other specified dermatitis: Secondary | ICD-10-CM | POA: Diagnosis not present

## 2018-11-28 DIAGNOSIS — L738 Other specified follicular disorders: Secondary | ICD-10-CM | POA: Diagnosis not present

## 2018-11-28 DIAGNOSIS — L570 Actinic keratosis: Secondary | ICD-10-CM | POA: Diagnosis not present

## 2018-12-27 ENCOUNTER — Encounter: Payer: Self-pay | Admitting: Gynecology

## 2019-02-11 DIAGNOSIS — Z0120 Encounter for dental examination and cleaning without abnormal findings: Secondary | ICD-10-CM | POA: Diagnosis not present

## 2019-04-10 ENCOUNTER — Ambulatory Visit: Payer: Medicare Other | Attending: Internal Medicine

## 2019-04-10 DIAGNOSIS — Z23 Encounter for immunization: Secondary | ICD-10-CM | POA: Insufficient documentation

## 2019-04-10 NOTE — Progress Notes (Signed)
   Covid-19 Vaccination Clinic  Name:  Courtney Holland    MRN: [id number] DOB: 07-07-1932  04/10/2019  [name] was observed post Covid-19 immunization for 15 minutes without incidence. She was provided with Vaccine Information Sheet and instruction to access the V-Safe system.   [name] was instructed to call 911 with any severe reactions post vaccine: Marland Kitchen Difficulty breathing  . Swelling of your face and throat  . A fast heartbeat  . A bad rash all over your body  . Dizziness and weakness    Immunizations Administered    Name Date Dose VIS Date Route   Pfizer COVID-19 Vaccine 04/10/2019  1:23 PM 0.3 mL 03/01/2019 Intramuscular   Manufacturer: [employer], [employer]   Lot: [id number]   NDC: [address]-9

## 2019-05-01 ENCOUNTER — Ambulatory Visit: Payer: Medicare Other | Attending: Internal Medicine

## 2019-05-01 DIAGNOSIS — Z23 Encounter for immunization: Secondary | ICD-10-CM | POA: Insufficient documentation

## 2019-05-01 NOTE — Progress Notes (Signed)
   Covid-19 Vaccination Clinic  Name:  Courtney Holland    MRN: [id number] DOB: 1933-02-19  05/01/2019  [name] was observed post Covid-19 immunization for 15 minutes without incidence. She was provided with Vaccine Information Sheet and instruction to access the V-Safe system.   [name] was instructed to call 911 with any severe reactions post vaccine: Marland Kitchen Difficulty breathing  . Swelling of your face and throat  . A fast heartbeat  . A bad rash all over your body  . Dizziness and weakness    Immunizations Administered    Name Date Dose VIS Date Route   Pfizer COVID-19 Vaccine 05/01/2019 10:36 AM 0.3 mL 03/01/2019 Intramuscular   Manufacturer: [employer], [employer]   Lot: [id number]   NDC: [address]-1

## 2019-05-02 DIAGNOSIS — N1831 Chronic kidney disease, stage 3a: Secondary | ICD-10-CM | POA: Diagnosis not present

## 2019-05-02 DIAGNOSIS — Z1389 Encounter for screening for other disorder: Secondary | ICD-10-CM | POA: Diagnosis not present

## 2019-05-02 DIAGNOSIS — M810 Age-related osteoporosis without current pathological fracture: Secondary | ICD-10-CM | POA: Diagnosis not present

## 2019-05-02 DIAGNOSIS — Z0000 Encounter for general adult medical examination without abnormal findings: Secondary | ICD-10-CM | POA: Diagnosis not present

## 2019-05-07 DIAGNOSIS — D3132 Benign neoplasm of left choroid: Secondary | ICD-10-CM | POA: Diagnosis not present

## 2019-05-07 DIAGNOSIS — H04123 Dry eye syndrome of bilateral lacrimal glands: Secondary | ICD-10-CM | POA: Diagnosis not present

## 2019-05-07 DIAGNOSIS — D3131 Benign neoplasm of right choroid: Secondary | ICD-10-CM | POA: Diagnosis not present

## 2019-05-07 DIAGNOSIS — H40013 Open angle with borderline findings, low risk, bilateral: Secondary | ICD-10-CM | POA: Diagnosis not present

## 2019-07-01 DIAGNOSIS — Z0120 Encounter for dental examination and cleaning without abnormal findings: Secondary | ICD-10-CM | POA: Diagnosis not present

## 2019-07-31 DIAGNOSIS — R42 Dizziness and giddiness: Secondary | ICD-10-CM | POA: Diagnosis not present

## 2019-07-31 DIAGNOSIS — H5461 Unqualified visual loss, right eye, normal vision left eye: Secondary | ICD-10-CM | POA: Diagnosis not present

## 2019-08-09 DIAGNOSIS — H5461 Unqualified visual loss, right eye, normal vision left eye: Secondary | ICD-10-CM | POA: Diagnosis not present

## 2019-08-09 DIAGNOSIS — I6521 Occlusion and stenosis of right carotid artery: Secondary | ICD-10-CM | POA: Diagnosis not present

## 2019-08-14 DIAGNOSIS — H53121 Transient visual loss, right eye: Secondary | ICD-10-CM | POA: Diagnosis not present

## 2019-08-14 DIAGNOSIS — H04123 Dry eye syndrome of bilateral lacrimal glands: Secondary | ICD-10-CM | POA: Diagnosis not present

## 2019-08-14 DIAGNOSIS — H43811 Vitreous degeneration, right eye: Secondary | ICD-10-CM | POA: Diagnosis not present

## 2019-08-14 DIAGNOSIS — H40013 Open angle with borderline findings, low risk, bilateral: Secondary | ICD-10-CM | POA: Diagnosis not present

## 2019-10-01 DIAGNOSIS — Z0120 Encounter for dental examination and cleaning without abnormal findings: Secondary | ICD-10-CM | POA: Diagnosis not present

## 2019-11-29 DIAGNOSIS — L570 Actinic keratosis: Secondary | ICD-10-CM | POA: Diagnosis not present

## 2019-11-29 DIAGNOSIS — C44212 Basal cell carcinoma of skin of right ear and external auricular canal: Secondary | ICD-10-CM | POA: Diagnosis not present

## 2019-11-29 DIAGNOSIS — Z85828 Personal history of other malignant neoplasm of skin: Secondary | ICD-10-CM | POA: Diagnosis not present

## 2019-11-29 DIAGNOSIS — L821 Other seborrheic keratosis: Secondary | ICD-10-CM | POA: Diagnosis not present

## 2019-11-29 DIAGNOSIS — L218 Other seborrheic dermatitis: Secondary | ICD-10-CM | POA: Diagnosis not present

## 2019-12-25 DIAGNOSIS — Z85828 Personal history of other malignant neoplasm of skin: Secondary | ICD-10-CM | POA: Diagnosis not present

## 2019-12-25 DIAGNOSIS — C44212 Basal cell carcinoma of skin of right ear and external auricular canal: Secondary | ICD-10-CM | POA: Diagnosis not present

## 2020-01-10 DIAGNOSIS — S0502XA Injury of conjunctiva and corneal abrasion without foreign body, left eye, initial encounter: Secondary | ICD-10-CM | POA: Diagnosis not present

## 2020-01-10 DIAGNOSIS — H04123 Dry eye syndrome of bilateral lacrimal glands: Secondary | ICD-10-CM | POA: Diagnosis not present

## 2020-05-05 ENCOUNTER — Other Ambulatory Visit: Payer: Self-pay | Admitting: Internal Medicine

## 2020-05-05 DIAGNOSIS — N1831 Chronic kidney disease, stage 3a: Secondary | ICD-10-CM | POA: Diagnosis not present

## 2020-05-05 DIAGNOSIS — M810 Age-related osteoporosis without current pathological fracture: Secondary | ICD-10-CM | POA: Diagnosis not present

## 2020-05-05 DIAGNOSIS — M1711 Unilateral primary osteoarthritis, right knee: Secondary | ICD-10-CM | POA: Diagnosis not present

## 2020-05-05 DIAGNOSIS — K5909 Other constipation: Secondary | ICD-10-CM | POA: Diagnosis not present

## 2020-05-07 DIAGNOSIS — H401133 Primary open-angle glaucoma, bilateral, severe stage: Secondary | ICD-10-CM | POA: Diagnosis not present

## 2020-05-07 DIAGNOSIS — H43811 Vitreous degeneration, right eye: Secondary | ICD-10-CM | POA: Diagnosis not present

## 2020-05-07 DIAGNOSIS — H04123 Dry eye syndrome of bilateral lacrimal glands: Secondary | ICD-10-CM | POA: Diagnosis not present

## 2020-05-07 DIAGNOSIS — D3131 Benign neoplasm of right choroid: Secondary | ICD-10-CM | POA: Diagnosis not present

## 2020-06-24 DIAGNOSIS — H401133 Primary open-angle glaucoma, bilateral, severe stage: Secondary | ICD-10-CM | POA: Diagnosis not present

## 2020-10-12 ENCOUNTER — Other Ambulatory Visit: Payer: Self-pay

## 2020-10-12 ENCOUNTER — Ambulatory Visit
Admission: RE | Admit: 2020-10-12 | Discharge: 2020-10-12 | Disposition: A | Discharge status: Home / Self Care | Payer: Medicare Other | Source: Ambulatory Visit | Attending: Internal Medicine | Admitting: Internal Medicine

## 2020-10-12 DIAGNOSIS — M810 Age-related osteoporosis without current pathological fracture: Secondary | ICD-10-CM

## 2020-10-12 DIAGNOSIS — M8588 Other specified disorders of bone density and structure, other site: Secondary | ICD-10-CM | POA: Diagnosis not present

## 2020-10-12 DIAGNOSIS — Z780 Asymptomatic menopausal state: Secondary | ICD-10-CM | POA: Diagnosis not present

## 2020-12-03 DIAGNOSIS — L814 Other melanin hyperpigmentation: Secondary | ICD-10-CM | POA: Diagnosis not present

## 2020-12-03 DIAGNOSIS — L820 Inflamed seborrheic keratosis: Secondary | ICD-10-CM | POA: Diagnosis not present

## 2020-12-03 DIAGNOSIS — Z85828 Personal history of other malignant neoplasm of skin: Secondary | ICD-10-CM | POA: Diagnosis not present

## 2020-12-03 DIAGNOSIS — L821 Other seborrheic keratosis: Secondary | ICD-10-CM | POA: Diagnosis not present

## 2020-12-03 DIAGNOSIS — D0462 Carcinoma in situ of skin of left upper limb, including shoulder: Secondary | ICD-10-CM | POA: Diagnosis not present

## 2020-12-22 DIAGNOSIS — H401131 Primary open-angle glaucoma, bilateral, mild stage: Secondary | ICD-10-CM | POA: Diagnosis not present

## 2021-01-06 DIAGNOSIS — E7800 Pure hypercholesterolemia, unspecified: Secondary | ICD-10-CM | POA: Diagnosis not present

## 2021-01-06 DIAGNOSIS — G8929 Other chronic pain: Secondary | ICD-10-CM | POA: Diagnosis not present

## 2021-01-06 DIAGNOSIS — K219 Gastro-esophageal reflux disease without esophagitis: Secondary | ICD-10-CM | POA: Diagnosis not present

## 2021-01-06 DIAGNOSIS — M1711 Unilateral primary osteoarthritis, right knee: Secondary | ICD-10-CM | POA: Diagnosis not present

## 2021-03-03 DIAGNOSIS — M1711 Unilateral primary osteoarthritis, right knee: Secondary | ICD-10-CM | POA: Diagnosis not present

## 2021-03-31 DIAGNOSIS — M1711 Unilateral primary osteoarthritis, right knee: Secondary | ICD-10-CM | POA: Diagnosis not present

## 2021-04-16 DIAGNOSIS — M810 Age-related osteoporosis without current pathological fracture: Secondary | ICD-10-CM | POA: Diagnosis not present

## 2021-04-16 DIAGNOSIS — M1711 Unilateral primary osteoarthritis, right knee: Secondary | ICD-10-CM | POA: Diagnosis not present

## 2021-04-16 DIAGNOSIS — N1831 Chronic kidney disease, stage 3a: Secondary | ICD-10-CM | POA: Diagnosis not present

## 2021-04-16 DIAGNOSIS — E7800 Pure hypercholesterolemia, unspecified: Secondary | ICD-10-CM | POA: Diagnosis not present

## 2021-05-05 DIAGNOSIS — M1711 Unilateral primary osteoarthritis, right knee: Secondary | ICD-10-CM | POA: Diagnosis not present

## 2021-05-06 DIAGNOSIS — Z0000 Encounter for general adult medical examination without abnormal findings: Secondary | ICD-10-CM | POA: Diagnosis not present

## 2021-05-06 DIAGNOSIS — M810 Age-related osteoporosis without current pathological fracture: Secondary | ICD-10-CM | POA: Diagnosis not present

## 2021-05-06 DIAGNOSIS — K5909 Other constipation: Secondary | ICD-10-CM | POA: Diagnosis not present

## 2021-05-06 DIAGNOSIS — K219 Gastro-esophageal reflux disease without esophagitis: Secondary | ICD-10-CM | POA: Diagnosis not present

## 2021-05-06 DIAGNOSIS — N1831 Chronic kidney disease, stage 3a: Secondary | ICD-10-CM | POA: Diagnosis not present

## 2021-06-04 DIAGNOSIS — Z85828 Personal history of other malignant neoplasm of skin: Secondary | ICD-10-CM | POA: Diagnosis not present

## 2021-06-04 DIAGNOSIS — L821 Other seborrheic keratosis: Secondary | ICD-10-CM | POA: Diagnosis not present

## 2021-06-04 DIAGNOSIS — D485 Neoplasm of uncertain behavior of skin: Secondary | ICD-10-CM | POA: Diagnosis not present

## 2021-06-04 DIAGNOSIS — L570 Actinic keratosis: Secondary | ICD-10-CM | POA: Diagnosis not present

## 2021-06-04 DIAGNOSIS — L820 Inflamed seborrheic keratosis: Secondary | ICD-10-CM | POA: Diagnosis not present

## 2021-06-04 DIAGNOSIS — L218 Other seborrheic dermatitis: Secondary | ICD-10-CM | POA: Diagnosis not present

## 2021-06-24 DIAGNOSIS — H04123 Dry eye syndrome of bilateral lacrimal glands: Secondary | ICD-10-CM | POA: Diagnosis not present

## 2021-06-24 DIAGNOSIS — H401131 Primary open-angle glaucoma, bilateral, mild stage: Secondary | ICD-10-CM | POA: Diagnosis not present

## 2021-06-24 DIAGNOSIS — D3131 Benign neoplasm of right choroid: Secondary | ICD-10-CM | POA: Diagnosis not present

## 2021-06-24 DIAGNOSIS — H43811 Vitreous degeneration, right eye: Secondary | ICD-10-CM | POA: Diagnosis not present

## 2021-08-09 DIAGNOSIS — M1711 Unilateral primary osteoarthritis, right knee: Secondary | ICD-10-CM | POA: Diagnosis not present

## 2021-11-03 DIAGNOSIS — F418 Other specified anxiety disorders: Secondary | ICD-10-CM | POA: Diagnosis not present

## 2021-12-03 DIAGNOSIS — R509 Fever, unspecified: Secondary | ICD-10-CM | POA: Diagnosis not present

## 2021-12-03 DIAGNOSIS — U071 COVID-19: Secondary | ICD-10-CM | POA: Diagnosis not present

## 2022-01-27 DIAGNOSIS — H401131 Primary open-angle glaucoma, bilateral, mild stage: Secondary | ICD-10-CM | POA: Diagnosis not present

## 2022-01-27 DIAGNOSIS — D3131 Benign neoplasm of right choroid: Secondary | ICD-10-CM | POA: Diagnosis not present

## 2022-01-27 DIAGNOSIS — H43811 Vitreous degeneration, right eye: Secondary | ICD-10-CM | POA: Diagnosis not present

## 2022-01-27 DIAGNOSIS — H04123 Dry eye syndrome of bilateral lacrimal glands: Secondary | ICD-10-CM | POA: Diagnosis not present

## 2022-02-28 DIAGNOSIS — M1711 Unilateral primary osteoarthritis, right knee: Secondary | ICD-10-CM | POA: Diagnosis not present

## 2022-03-10 DIAGNOSIS — H9313 Tinnitus, bilateral: Secondary | ICD-10-CM | POA: Diagnosis not present

## 2022-03-31 DIAGNOSIS — H903 Sensorineural hearing loss, bilateral: Secondary | ICD-10-CM | POA: Diagnosis not present

## 2022-05-09 DIAGNOSIS — M810 Age-related osteoporosis without current pathological fracture: Secondary | ICD-10-CM | POA: Diagnosis not present

## 2022-05-09 DIAGNOSIS — Z1331 Encounter for screening for depression: Secondary | ICD-10-CM | POA: Diagnosis not present

## 2022-05-09 DIAGNOSIS — K219 Gastro-esophageal reflux disease without esophagitis: Secondary | ICD-10-CM | POA: Diagnosis not present

## 2022-05-09 DIAGNOSIS — N1831 Chronic kidney disease, stage 3a: Secondary | ICD-10-CM | POA: Diagnosis not present

## 2022-05-09 DIAGNOSIS — Z79899 Other long term (current) drug therapy: Secondary | ICD-10-CM | POA: Diagnosis not present

## 2022-05-09 DIAGNOSIS — E559 Vitamin D deficiency, unspecified: Secondary | ICD-10-CM | POA: Diagnosis not present

## 2022-05-09 DIAGNOSIS — F418 Other specified anxiety disorders: Secondary | ICD-10-CM | POA: Diagnosis not present

## 2022-05-09 DIAGNOSIS — D649 Anemia, unspecified: Secondary | ICD-10-CM | POA: Diagnosis not present

## 2022-05-09 DIAGNOSIS — E890 Postprocedural hypothyroidism: Secondary | ICD-10-CM | POA: Diagnosis not present

## 2022-05-09 DIAGNOSIS — Z0000 Encounter for general adult medical examination without abnormal findings: Secondary | ICD-10-CM | POA: Diagnosis not present

## 2022-05-30 DIAGNOSIS — M1711 Unilateral primary osteoarthritis, right knee: Secondary | ICD-10-CM | POA: Diagnosis not present

## 2022-07-05 ENCOUNTER — Emergency Department (HOSPITAL_BASED_OUTPATIENT_CLINIC_OR_DEPARTMENT_OTHER): Payer: Medicare Other

## 2022-07-05 ENCOUNTER — Emergency Department (HOSPITAL_BASED_OUTPATIENT_CLINIC_OR_DEPARTMENT_OTHER)
Admission: EM | Admit: 2022-07-05 | Discharge: 2022-07-05 | Disposition: A | Discharge status: Home / Self Care | Payer: Medicare Other | Attending: Emergency Medicine | Admitting: Emergency Medicine

## 2022-07-05 ENCOUNTER — Other Ambulatory Visit (HOSPITAL_BASED_OUTPATIENT_CLINIC_OR_DEPARTMENT_OTHER): Payer: Self-pay

## 2022-07-05 ENCOUNTER — Encounter (HOSPITAL_BASED_OUTPATIENT_CLINIC_OR_DEPARTMENT_OTHER): Payer: Self-pay

## 2022-07-05 ENCOUNTER — Emergency Department (HOSPITAL_BASED_OUTPATIENT_CLINIC_OR_DEPARTMENT_OTHER): Payer: Medicare Other | Admitting: Radiology

## 2022-07-05 ENCOUNTER — Other Ambulatory Visit: Payer: Self-pay

## 2022-07-05 DIAGNOSIS — Z7982 Long term (current) use of aspirin: Secondary | ICD-10-CM | POA: Diagnosis not present

## 2022-07-05 DIAGNOSIS — W19XXXA Unspecified fall, initial encounter: Secondary | ICD-10-CM

## 2022-07-05 DIAGNOSIS — I6782 Cerebral ischemia: Secondary | ICD-10-CM | POA: Diagnosis not present

## 2022-07-05 DIAGNOSIS — S52572A Other intraarticular fracture of lower end of left radius, initial encounter for closed fracture: Secondary | ICD-10-CM | POA: Diagnosis not present

## 2022-07-05 DIAGNOSIS — M25532 Pain in left wrist: Secondary | ICD-10-CM | POA: Diagnosis not present

## 2022-07-05 DIAGNOSIS — Z043 Encounter for examination and observation following other accident: Secondary | ICD-10-CM | POA: Diagnosis not present

## 2022-07-05 DIAGNOSIS — M4312 Spondylolisthesis, cervical region: Secondary | ICD-10-CM | POA: Diagnosis not present

## 2022-07-05 DIAGNOSIS — M79672 Pain in left foot: Secondary | ICD-10-CM | POA: Diagnosis not present

## 2022-07-05 DIAGNOSIS — W108XXA Fall (on) (from) other stairs and steps, initial encounter: Secondary | ICD-10-CM | POA: Insufficient documentation

## 2022-07-05 DIAGNOSIS — Y93E5 Activity, floor mopping and cleaning: Secondary | ICD-10-CM | POA: Diagnosis not present

## 2022-07-05 DIAGNOSIS — S0990XA Unspecified injury of head, initial encounter: Secondary | ICD-10-CM | POA: Diagnosis not present

## 2022-07-05 DIAGNOSIS — S52502A Unspecified fracture of the lower end of left radius, initial encounter for closed fracture: Secondary | ICD-10-CM | POA: Diagnosis not present

## 2022-07-05 NOTE — ED Provider Notes (Signed)
[place] [facility]ERGENCY DEPARTMENT AT [facility] Provider Note   CSN: [id number] Arrival date & time: 07/05/22  4098     History  Chief Complaint  Patient presents with   Courtney Holland is a 89 y.o. female.   Fall   89 year old female presents emergency department after fall.  Patient states that she was stepping backwards down her front porch steps yesterday sweeping when her foot caught an object on the steps causing her to fall down 2 steps landing on her back.  She states that the lawn chair was there and broke her fall.  Denies trauma to head, loss of consciousness, blood thinner use.  Currently complaining of left heel pain as well as left wrist pain.  States she was able to get up independently and has been able to ambulate independently since onset.  Accompanied by niece who is [employer] historian.  Denies chest pain, shortness of breath, abdominal pain, nausea, vomiting, weakness/sensory deficits in upper or lower extremities, slurred speech, facial droop, gait abnormalities.  Has taken Tylenol which has significantly improved her symptoms at home.  Past medical history significant for osteoporosis  Home Medications Prior to Admission medications   Medication Sig Start Date End Date Taking? Authorizing Provider  acetaminophen (TYLENOL) 500 MG tablet Take 1,000 mg by mouth every 6 (six) hours as needed.    [provider]  alendronate (FOSAMAX) 70 MG tablet Take 1 tablet (70 mg total) by mouth every 7 (seven) days. Take with a full glass of water on an empty stomach. Patient not taking: Reported on 10/25/2014 03/26/14   Fontaine, Nadyne Coombes, MD  aspirin 81 MG tablet Take 81 mg by mouth daily.    [provider]  Calcium Carbonate-Vitamin D (CALCIUM + D PO) Take by mouth.    [provider]  Cholecalciferol (VITAMIN D PO) Take by mouth.    [provider]  doxepin (SINEQUAN) 50 MG capsule Take 50 mg by mouth.    [provider]   doxycycline (VIBRAMYCIN) 100 MG capsule Take 1 capsule (100 mg total) by mouth 2 (two) times daily. Patient not taking: Reported on 07/05/2022 10/25/14   Tilden Fossa, MD  Glucosamine-Chondroitin (GLUCOSAMINE [facility] COMPLEX PO) Take by mouth.    [provider]  latanoprost (XALATAN) 0.005 % ophthalmic solution Place 1 drop into both eyes daily.    [provider]  Multiple Vitamin (MULTIVITAMIN) tablet Take 1 tablet by mouth daily.    [provider]  pantoprazole (PROTONIX) 40 MG tablet Take 40 mg by mouth daily.    [provider]  valACYclovir (VALTREX) 500 MG tablet Take 500 mg by mouth 2 (two) times daily.    [provider]      Allergies    Adhesive [tape], Oxycodone, and Polysporin [bacitracin-polymyxin b]    Review of Systems   Review of Systems  All other systems reviewed and are negative.   Physical Exam Updated Vital Signs BP (!) 168/76 (BP Location: Right Arm)   Pulse 74   Temp 98.4 F (36.9 C) (Oral)   Resp 16   Ht  (1.575 m)   Wt 59 kg   SpO2 100%   BMI 23.78 kg/m  Physical Exam Vitals and nursing note reviewed.  Constitutional:      General: She is not in acute distress.    Appearance: She is well-developed.  HENT:     Head: Normocephalic and atraumatic.  Eyes:  Conjunctiva/sclera: Conjunctivae normal.  Cardiovascular:     Rate and Rhythm: Normal rate and regular rhythm.     Heart sounds: No murmur heard. Pulmonary:     Effort: Pulmonary effort is normal. No respiratory distress.     Breath sounds: Normal breath sounds. No stridor. No wheezing, rhonchi or rales.  Abdominal:     Palpations: Abdomen is soft.     Tenderness: There is no abdominal tenderness. There is no right CVA tenderness, left CVA tenderness or guarding.  Musculoskeletal:        General: No swelling.     Cervical back: Neck supple.     Right lower leg: No edema.     Left lower leg: No edema.     Comments: No midline tenderness  of cervical, thoracic, lumbar spine with no obvious step-off or deformity noted.  Paraspinal tenderness to the left cervical region but not otherwise appreciated in paraspinal regions throughout spine.  No chest wall tenderness to palpation.  Patient with swelling noted on the dorsal aspect of left wrist to palpation of distal radius.  Patient has full range of motion of bilateral arms shoulders, elbows, wrists, digits.  Radial pulses 2+ bilaterally.  No sensory deficits along major nerve distributions of upper extremities.  Patient with full range of motion of bilateral hips, knees, ankles, digits.  Mild tender to palpation of left heel but otherwise, no tenderness to palpations of lower extremities.  Pedal pulses 2+ bilaterally.  No sensory deficits along major nerve distributions lower extremities.  No active bleeding appreciated or laceration/skin avulsions.  Skin:    General: Skin is warm and dry.     Capillary Refill: Capillary refill takes less than 2 seconds.  Neurological:     Mental Status: She is alert.  Psychiatric:        Mood and Affect: Mood normal.     ED Results / Procedures / Treatments   Labs (all labs ordered are listed, but only abnormal results are displayed) Labs Reviewed - No data to display  EKG None  Radiology DG Foot Complete Left  Result Date: 07/05/2022 CLINICAL DATA:  Left heel pain after fall. EXAM: LEFT FOOT - COMPLETE 3+ VIEW COMPARISON:  None Available. FINDINGS: There is no evidence of fracture or dislocation. Mild osteoarthritis of the IP joints and first MTP joint. Soft tissues are unremarkable. IMPRESSION: 1. No acute osseous abnormality. Electronically Signed   By: Obie Dredge M.D.   On: 07/05/2022 10:50   DG Wrist Complete Left  Result Date: 07/05/2022 CLINICAL DATA:  Fall.  Pain in left wrist. EXAM: LEFT WRIST - COMPLETE 3+ VIEW COMPARISON:  None Available. FINDINGS: Four views of the left wrist. Impacted fracture of the left distal radius with  possible intra-articular extension. Mild soft tissue swelling along the dorsal aspect of the wrist. Diffuse osteopenia. Osteoarthritis involving the first CMC, MCP and IP joints. IMPRESSION: Impacted fracture of the left distal radius with possible intra-articular extension. Electronically Signed   By: Orvan Falconer M.D.   On: 07/05/2022 10:46   CT Head Wo Contrast  Result Date: 07/05/2022 CLINICAL DATA:  Neck trauma EXAM: CT HEAD WITHOUT CONTRAST CT CERVICAL SPINE WITHOUT CONTRAST TECHNIQUE: Multidetector CT imaging of the head and cervical spine was performed following the standard protocol without intravenous contrast. Multiplanar CT image reconstructions of the cervical spine were also generated. RADIATION DOSE REDUCTION: This exam was performed according to the departmental dose-optimization program which includes automated exposure control, adjustment of the mA and/or kV  according to patient size and/or use of iterative reconstruction technique. COMPARISON:  None Available. FINDINGS: CT HEAD FINDINGS Brain: No evidence of acute infarction, hemorrhage, hydrocephalus, extra-axial collection or mass lesion/mass effect. Sequela of mild chronic microvascular ischemic change. Mineralization of the basal ganglia on the right. Vascular: No hyperdense vessel or unexpected calcification. Skull: Normal. Negative for fracture or focal lesion. Sinuses/Orbits: No middle ear or mastoid effusion. Paranasal sinuses are clear. Bilateral lens replacement. Orbits are otherwise unremarkable. Other: None. CT CERVICAL SPINE FINDINGS Alignment: Grade 1 anterolisthesis of C2 on C3 and C4 on C5. Trace retrolisthesis of C5 on C6. Skull base and vertebrae: No acute fracture. No primary bone lesion or focal pathologic process. Soft tissues and spinal canal: No prevertebral fluid or swelling. No visible canal hematoma. Disc levels:  No evidence of high-grade spinal canal stenosis. Upper chest: Negative. Other: Status post right  hemithyroidectomy. IMPRESSION: 1. No acute intracranial abnormality. 2. No acute fracture or traumatic malalignment in the cervical spine. Electronically Signed   By: Lorenza Cambridge M.D.   On: 07/05/2022 10:35   CT Cervical Spine Wo Contrast  Result Date: 07/05/2022 CLINICAL DATA:  Neck trauma EXAM: CT HEAD WITHOUT CONTRAST CT CERVICAL SPINE WITHOUT CONTRAST TECHNIQUE: Multidetector CT imaging of the head and cervical spine was performed following the standard protocol without intravenous contrast. Multiplanar CT image reconstructions of the cervical spine were also generated. RADIATION DOSE REDUCTION: This exam was performed according to the departmental dose-optimization program which includes automated exposure control, adjustment of the mA and/or kV according to patient size and/or use of iterative reconstruction technique. COMPARISON:  None Available. FINDINGS: CT HEAD FINDINGS Brain: No evidence of acute infarction, hemorrhage, hydrocephalus, extra-axial collection or mass lesion/mass effect. Sequela of mild chronic microvascular ischemic change. Mineralization of the basal ganglia on the right. Vascular: No hyperdense vessel or unexpected calcification. Skull: Normal. Negative for fracture or focal lesion. Sinuses/Orbits: No middle ear or mastoid effusion. Paranasal sinuses are clear. Bilateral lens replacement. Orbits are otherwise unremarkable. Other: None. CT CERVICAL SPINE FINDINGS Alignment: Grade 1 anterolisthesis of C2 on C3 and C4 on C5. Trace retrolisthesis of C5 on C6. Skull base and vertebrae: No acute fracture. No primary bone lesion or focal pathologic process. Soft tissues and spinal canal: No prevertebral fluid or swelling. No visible canal hematoma. Disc levels:  No evidence of high-grade spinal canal stenosis. Upper chest: Negative. Other: Status post right hemithyroidectomy. IMPRESSION: 1. No acute intracranial abnormality. 2. No acute fracture or traumatic malalignment in the cervical  spine. Electronically Signed   By: Lorenza Cambridge M.D.   On: 07/05/2022 10:35   DG Pelvis Portable  Result Date: 07/05/2022 CLINICAL DATA:  Fall. EXAM: PORTABLE PELVIS 1-2 VIEWS COMPARISON:  None Available. FINDINGS: There is no evidence of pelvic fracture or diastasis. No pelvic bone lesions are seen. IMPRESSION: Negative pelvic radiograph. Electronically Signed   By: Orvan Falconer M.D.   On: 07/05/2022 10:35   DG Chest Port 1 View  Result Date: 07/05/2022 CLINICAL DATA:  Fall. EXAM: PORTABLE CHEST 1 VIEW COMPARISON:  12/09/2014. FINDINGS: Clear lungs. Normal heart size and mediastinal contours. No pleural effusion or pneumothorax. Visualized bones and upper abdomen are unremarkable. IMPRESSION: No evidence of acute cardiopulmonary disease. Electronically Signed   By: Orvan Falconer M.D.   On: 07/05/2022 10:34    Procedures Procedures    Medications Ordered in ED Medications - No data to display  ED Course/ Medical Decision Making/ A&P  Medical Decision Making Amount and/or Complexity of Data Reviewed Radiology: ordered.   This patient presents to the ED for concern of fall, this involves an extensive number of treatment options, and is a complaint that carries with it a high risk of complications and morbidity.  The differential diagnosis includes CVA, fracture, strain/sprain, dislocation, pneumothorax, spinal cord injury, solid organ damage   Co morbidities that complicate the patient evaluation  See HPI   Additional history obtained:  Additional history obtained from EMR External records from outside source obtained and reviewed including hospital records   Lab Tests:  N/a   Imaging Studies ordered:  I ordered imaging studies including CT head/C-spine, chest x-ray, pelvis x-ray, left wrist x-ray, left foot x-ray I independently visualized and interpreted imaging which showed  CT head/C-spine: No acute intracranial abnormality.  No  acute fracture or subluxation of cervical spine. Chest x-ray: No acute cardiopulmonary abnormalities Pelvis x-ray: No acute abnormalities Left wrist x-ray: Impacted fracture of left distal radius with possible intra-articular extension Left foot x-ray: No acute osseous abnormality I agree with the radiologist interpretation   Cardiac Monitoring: / EKG:  The patient was maintained on a cardiac monitor.  I personally viewed and interpreted the cardiac monitored which showed an underlying rhythm of: Sinus rhythm   Consultations Obtained:  I requested consultation with attending physician Dr. Betti Cruz who was in agreement with treatment plan going forward  Problem List / ED Course / Critical interventions / Medication management  Fall, left wrist fracture Reevaluation of the patient showed that the patient stayed the same I have reviewed the patients home medicines and have made adjustments as needed   Social Determinants of Health:  Denies tobacco, illicit drug use   Test / Admission - Considered:  Fall/distal left radius fracture Vitals signs significant for mild hypertension with blood pressure 168/76. Otherwise within normal range and stable throughout visit. Imaging studies significant for: See above 89 year old female presents emergency department after mechanical fall.  Workup today overall reassuring with evidence of left sided distal radius fracture.  Patient overall well-appearing, in no acute distress, and ambulating independently without assistance.  Patient states she has multiple family members who can help take care of her in her if needs arise given left wrist fracture.  Patient recommended close follow-up with [facility] given evidence of fracture being most likely intra-articular.  Patient placed in sugar-tong splint by nursing staff on the emergency department as well as left sling.  Patient recommended rest, ice, elevation, Tylenol for pain.  Treatment plan  discussed at length with patient and she acknowledged understanding was agreeable to said plan. Worrisome signs and symptoms were discussed with the patient, and the patient acknowledged understanding to return to the ED if noticed. Patient was stable upon discharge.          Final Clinical Impression(s) / ED Diagnoses Final diagnoses:  Fall, initial encounter  Other closed intra-articular fracture of distal end of left radius, initial encounter    Rx / DC Orders ED Discharge Orders     None         Peter Garter, [place] 07/05/22 1216    Margarita Grizzle, MD 07/08/22 [phone]

## 2022-07-05 NOTE — ED Triage Notes (Signed)
Onset yesterday.  States was sweeping stairs and backing down steps  tripped and fell.  Denies hitting head of LOC.  Pain to left wrist and left heel.  Ambulatory without difficulty.  Left wrist wrapped with ace wrap.

## 2022-07-05 NOTE — ED Notes (Signed)
Patient verbalizes understanding of discharge instructions. Opportunity for questioning and answers were provided. Patient discharged from ED.  °

## 2022-07-05 NOTE — Discharge Instructions (Addendum)
Note that your workup today was overall consistent with fracture of your left wrist.  Other imaging was negative for any brain bleed, fracture, dislocation.  As discussed, recommend rest, ice, elevation of affected wrist as well as taking Tylenol for pain.  Recommend calling orthopedics number attached to set up an appointment.  Please not hesitate to return to emergency department if the worrisome signs and symptoms we discussed become apparent.

## 2022-07-06 DIAGNOSIS — S52502A Unspecified fracture of the lower end of left radius, initial encounter for closed fracture: Secondary | ICD-10-CM | POA: Diagnosis not present

## 2022-07-13 DIAGNOSIS — S52502A Unspecified fracture of the lower end of left radius, initial encounter for closed fracture: Secondary | ICD-10-CM | POA: Diagnosis not present

## 2022-07-27 DIAGNOSIS — K080 Exfoliation of teeth due to systemic causes: Secondary | ICD-10-CM | POA: Diagnosis not present

## 2022-08-02 DIAGNOSIS — S52502A Unspecified fracture of the lower end of left radius, initial encounter for closed fracture: Secondary | ICD-10-CM | POA: Diagnosis not present

## 2022-08-03 DIAGNOSIS — Z961 Presence of intraocular lens: Secondary | ICD-10-CM | POA: Diagnosis not present

## 2022-08-03 DIAGNOSIS — H04123 Dry eye syndrome of bilateral lacrimal glands: Secondary | ICD-10-CM | POA: Diagnosis not present

## 2022-08-03 DIAGNOSIS — H401131 Primary open-angle glaucoma, bilateral, mild stage: Secondary | ICD-10-CM | POA: Diagnosis not present

## 2022-08-03 DIAGNOSIS — H43811 Vitreous degeneration, right eye: Secondary | ICD-10-CM | POA: Diagnosis not present

## 2022-08-22 DIAGNOSIS — S52502A Unspecified fracture of the lower end of left radius, initial encounter for closed fracture: Secondary | ICD-10-CM | POA: Diagnosis not present

## 2022-09-12 DIAGNOSIS — M1711 Unilateral primary osteoarthritis, right knee: Secondary | ICD-10-CM | POA: Diagnosis not present

## 2022-09-29 DIAGNOSIS — D0471 Carcinoma in situ of skin of right lower limb, including hip: Secondary | ICD-10-CM | POA: Diagnosis not present

## 2022-11-08 DIAGNOSIS — F418 Other specified anxiety disorders: Secondary | ICD-10-CM | POA: Diagnosis not present

## 2022-11-08 DIAGNOSIS — D649 Anemia, unspecified: Secondary | ICD-10-CM | POA: Diagnosis not present

## 2022-11-08 DIAGNOSIS — K219 Gastro-esophageal reflux disease without esophagitis: Secondary | ICD-10-CM | POA: Diagnosis not present

## 2022-11-08 DIAGNOSIS — F5101 Primary insomnia: Secondary | ICD-10-CM | POA: Diagnosis not present

## 2022-11-15 DIAGNOSIS — H524 Presbyopia: Secondary | ICD-10-CM | POA: Diagnosis not present

## 2022-12-07 DIAGNOSIS — K080 Exfoliation of teeth due to systemic causes: Secondary | ICD-10-CM | POA: Diagnosis not present

## 2022-12-21 DIAGNOSIS — M1711 Unilateral primary osteoarthritis, right knee: Secondary | ICD-10-CM | POA: Diagnosis not present

## 2022-12-27 DIAGNOSIS — L720 Epidermal cyst: Secondary | ICD-10-CM | POA: Diagnosis not present

## 2022-12-27 DIAGNOSIS — L814 Other melanin hyperpigmentation: Secondary | ICD-10-CM | POA: Diagnosis not present

## 2022-12-27 DIAGNOSIS — L821 Other seborrheic keratosis: Secondary | ICD-10-CM | POA: Diagnosis not present

## 2022-12-27 DIAGNOSIS — D485 Neoplasm of uncertain behavior of skin: Secondary | ICD-10-CM | POA: Diagnosis not present

## 2022-12-27 DIAGNOSIS — L570 Actinic keratosis: Secondary | ICD-10-CM | POA: Diagnosis not present

## 2022-12-27 DIAGNOSIS — Z85828 Personal history of other malignant neoplasm of skin: Secondary | ICD-10-CM | POA: Diagnosis not present

## 2023-02-07 DIAGNOSIS — H401131 Primary open-angle glaucoma, bilateral, mild stage: Secondary | ICD-10-CM | POA: Diagnosis not present

## 2023-02-07 DIAGNOSIS — H43811 Vitreous degeneration, right eye: Secondary | ICD-10-CM | POA: Diagnosis not present

## 2023-02-07 DIAGNOSIS — H04123 Dry eye syndrome of bilateral lacrimal glands: Secondary | ICD-10-CM | POA: Diagnosis not present

## 2023-02-07 DIAGNOSIS — Z961 Presence of intraocular lens: Secondary | ICD-10-CM | POA: Diagnosis not present

## 2023-04-10 DIAGNOSIS — M1711 Unilateral primary osteoarthritis, right knee: Secondary | ICD-10-CM | POA: Diagnosis not present

## 2023-05-03 DIAGNOSIS — K080 Exfoliation of teeth due to systemic causes: Secondary | ICD-10-CM | POA: Diagnosis not present

## 2023-05-12 DIAGNOSIS — E559 Vitamin D deficiency, unspecified: Secondary | ICD-10-CM | POA: Diagnosis not present

## 2023-05-12 DIAGNOSIS — Z0000 Encounter for general adult medical examination without abnormal findings: Secondary | ICD-10-CM | POA: Diagnosis not present

## 2023-05-12 DIAGNOSIS — K219 Gastro-esophageal reflux disease without esophagitis: Secondary | ICD-10-CM | POA: Diagnosis not present

## 2023-05-12 DIAGNOSIS — E890 Postprocedural hypothyroidism: Secondary | ICD-10-CM | POA: Diagnosis not present

## 2023-05-12 DIAGNOSIS — F5101 Primary insomnia: Secondary | ICD-10-CM | POA: Diagnosis not present

## 2023-05-12 DIAGNOSIS — D649 Anemia, unspecified: Secondary | ICD-10-CM | POA: Diagnosis not present

## 2023-05-12 DIAGNOSIS — N1831 Chronic kidney disease, stage 3a: Secondary | ICD-10-CM | POA: Diagnosis not present

## 2023-05-24 DIAGNOSIS — E875 Hyperkalemia: Secondary | ICD-10-CM | POA: Diagnosis not present

## 2023-05-30 DIAGNOSIS — H903 Sensorineural hearing loss, bilateral: Secondary | ICD-10-CM | POA: Diagnosis not present

## 2023-07-19 DIAGNOSIS — M1711 Unilateral primary osteoarthritis, right knee: Secondary | ICD-10-CM | POA: Diagnosis not present

## 2023-08-29 ENCOUNTER — Encounter (HOSPITAL_BASED_OUTPATIENT_CLINIC_OR_DEPARTMENT_OTHER): Payer: Self-pay

## 2023-08-29 ENCOUNTER — Emergency Department (HOSPITAL_BASED_OUTPATIENT_CLINIC_OR_DEPARTMENT_OTHER)
Admission: EM | Admit: 2023-08-29 | Discharge: 2023-08-29 | Disposition: A | Discharge status: Home / Self Care | Attending: Emergency Medicine | Admitting: Emergency Medicine

## 2023-08-29 ENCOUNTER — Emergency Department (HOSPITAL_BASED_OUTPATIENT_CLINIC_OR_DEPARTMENT_OTHER)

## 2023-08-29 DIAGNOSIS — W19XXXA Unspecified fall, initial encounter: Secondary | ICD-10-CM

## 2023-08-29 DIAGNOSIS — T148XXA Other injury of unspecified body region, initial encounter: Secondary | ICD-10-CM

## 2023-08-29 DIAGNOSIS — S0003XA Contusion of scalp, initial encounter: Secondary | ICD-10-CM | POA: Diagnosis not present

## 2023-08-29 DIAGNOSIS — R9089 Other abnormal findings on diagnostic imaging of central nervous system: Secondary | ICD-10-CM | POA: Diagnosis not present

## 2023-08-29 DIAGNOSIS — S0083XA Contusion of other part of head, initial encounter: Secondary | ICD-10-CM | POA: Diagnosis not present

## 2023-08-29 DIAGNOSIS — Z7982 Long term (current) use of aspirin: Secondary | ICD-10-CM | POA: Diagnosis not present

## 2023-08-29 DIAGNOSIS — S51011A Laceration without foreign body of right elbow, initial encounter: Secondary | ICD-10-CM | POA: Diagnosis not present

## 2023-08-29 DIAGNOSIS — S59901A Unspecified injury of right elbow, initial encounter: Secondary | ICD-10-CM | POA: Diagnosis not present

## 2023-08-29 DIAGNOSIS — W108XXA Fall (on) (from) other stairs and steps, initial encounter: Secondary | ICD-10-CM | POA: Diagnosis not present

## 2023-08-29 DIAGNOSIS — I6523 Occlusion and stenosis of bilateral carotid arteries: Secondary | ICD-10-CM | POA: Diagnosis not present

## 2023-08-29 NOTE — ED Notes (Signed)
 DC paperwork given and verbally understood.

## 2023-08-29 NOTE — ED Triage Notes (Addendum)
 Pt presents c/o mechanical fall ~1.5 hours PTA, hitting head on floor. Hematoma noted to right forehead, skin tear to right elbow, wrapped in triage. Denies thinners, denies LOC/dizziness/vision changes

## 2023-08-29 NOTE — Discharge Instructions (Signed)
 As discussed, your imaging is reassuring.  No signs of broken bones or brain bleeds as a result of the fall.  For your hematoma on your forehead, you can apply ice to it to help with swelling.  You can take Tylenol 500 mg every 6-8 hours as needed for pain.  You might be more sore the next day or 2 then today, which is normal.  Follow-up with your primary care provider in the next 3 to 5 days for reevaluation.  Get help right away if: You have sudden: Headache that is very bad. Vomiting that does not stop. Changes in the size of one of your pupils. Pupils are the black centers of your eyes. Changes in how you see (vision). More confusion or more grumpy moods. You have a seizure. Your symptoms get worse. You have a clear or bloody fluid coming from your nose or ears.

## 2023-08-29 NOTE — ED Provider Notes (Signed)
 [place] [facility] AT [facility] Provider Note   CSN: [id number] Arrival date & time: 08/29/23  1802     History  Chief Complaint  Patient presents with   Zyia Kaneko is a 89 y.o. female with a history of osteoporosis and kidney disease who presents the ED today after a mechanical fall.  Patient reports that she fell walking up stairs and hit her head on the floor.  Denies LOC or blood thinner use.  No vision changes, headache, weakness, or confusion.  At the time of evaluation, she states that she is feeling better than before.  Denies any pain to the back or extremities.  No additional complaints or concerns at this time.    Home Medications Prior to Admission medications   Medication Sig Start Date End Date Taking? Authorizing Provider  acetaminophen (TYLENOL) 500 MG tablet Take 1,000 mg by mouth every 6 (six) hours as needed.    [provider]  alendronate  (FOSAMAX ) 70 MG tablet Take 1 tablet (70 mg total) by mouth every 7 (seven) days. Take with a full glass of water on an empty stomach. Patient not taking: Reported on 10/25/2014 03/26/14   Fontaine, Fatima Honour, MD  aspirin 81 MG tablet Take 81 mg by mouth daily.    [provider]  Calcium Carbonate-Vitamin D (CALCIUM + D PO) Take by mouth.    [provider]  Cholecalciferol (VITAMIN D PO) Take by mouth.    [provider]  doxepin (SINEQUAN) 50 MG capsule Take 50 mg by mouth.    [provider]  doxycycline  (VIBRAMYCIN ) 100 MG capsule Take 1 capsule (100 mg total) by mouth 2 (two) times daily. Patient not taking: Reported on 07/05/2022 10/25/14   Kelsey Patricia, MD  Glucosamine-Chondroitin (GLUCOSAMINE CHONDR COMPLEX PO) Take by mouth.    [provider]  latanoprost (XALATAN) 0.005 % ophthalmic solution Place 1 drop into both eyes daily.    [provider]  Multiple Vitamin (MULTIVITAMIN) tablet Take 1 tablet by mouth daily.     [provider]  pantoprazole (PROTONIX) 40 MG tablet Take 40 mg by mouth daily.    [provider]  valACYclovir (VALTREX) 500 MG tablet Take 500 mg by mouth 2 (two) times daily.    [provider]      Allergies    Adhesive [tape], Oxycodone, and Polysporin [bacitracin-polymyxin b]    Review of Systems   Review of Systems  HENT:  Positive for facial swelling.        Hematoma  All other systems reviewed and are negative.   Physical Exam Updated Vital Signs BP (!) 165/68 (BP Location: Right Arm)   Pulse 84   Temp 98.9 F (37.2 C)   Resp 16   Ht 5' 4 (1.626 m)   Wt 62.6 kg   SpO2 100%   BMI 23.69 kg/m  Physical Exam Vitals and nursing note reviewed.  Constitutional:      General: She is not in acute distress.    Appearance: Normal appearance.  HENT:     Head: Normocephalic.     Comments: Purple/red hematoma above right eyebrow on the forehead.  No Battle sign or raccoon eyes.    Mouth/Throat:     Mouth: Mucous membranes are moist.  Eyes:     Conjunctiva/sclera: Conjunctivae normal.     Pupils: Pupils are equal, round, and reactive to light.  Cardiovascular:     Rate and Rhythm: Normal  rate and regular rhythm.     Pulses: Normal pulses.     Heart sounds: Normal heart sounds.  Pulmonary:     Effort: Pulmonary effort is normal.     Breath sounds: Normal breath sounds.  Abdominal:     Palpations: Abdomen is soft.     Tenderness: There is no abdominal tenderness.  Musculoskeletal:        General: No tenderness. Normal range of motion.     Cervical back: Normal range of motion. No tenderness.     Comments: No tenderness to palpation of the upper or lower extremities as well as to palpation of the spine.  Range of motion of upper and lower extremity intact bilaterally.  Skin:    General: Skin is warm and dry.     Findings: No rash.     Comments: 1 cm skin tear to the lateral right arm near the elbow.  Neurological:     General: No focal  deficit present.     Mental Status: She is alert.     Sensory: No sensory deficit.     Motor: No weakness.  Psychiatric:        Mood and Affect: Mood normal.        Behavior: Behavior normal.    ED Results / Procedures / Treatments   Labs (all labs ordered are listed, but only abnormal results are displayed) Labs Reviewed - No data to display  EKG None  Radiology CT Head Wo Contrast Result Date: 08/29/2023 CLINICAL DATA:  fall EXAM: CT HEAD WITHOUT CONTRAST CT MAXILLOFACIAL WITHOUT CONTRAST CT CERVICAL SPINE WITHOUT CONTRAST TECHNIQUE: Multidetector CT imaging of the head, cervical spine, and maxillofacial structures were performed using the standard protocol without intravenous contrast. Multiplanar CT image reconstructions of the cervical spine and maxillofacial structures were also generated. RADIATION DOSE REDUCTION: This exam was performed according to the departmental dose-optimization program which includes automated exposure control, adjustment of the mA and/or kV according to patient size and/or use of iterative reconstruction technique. COMPARISON:  None Available. FINDINGS: CT HEAD FINDINGS Brain: Cerebral ventricle sizes are concordant with the degree of cerebral volume loss. Patchy and confluent areas of decreased attenuation are noted throughout the deep and periventricular white matter of the cerebral hemispheres bilaterally, compatible with chronic microvascular ischemic disease. No evidence of large-territorial acute infarction. No parenchymal hemorrhage. No mass lesion. No extra-axial collection. No mass effect or midline shift. No hydrocephalus. Basilar cisterns are patent. Vascular: No hyperdense vessel. Atherosclerotic calcifications are present within the cavernous internal carotid arteries. Skull: No acute fracture or focal lesion. Other: Right frontal scalp 2 mm hematoma. CT MAXILLOFACIAL FINDINGS Osseous: No fracture or mandibular dislocation. No destructive process.  Sinuses/Orbits: Paranasal sinuses and mastoid air cells are clear. Bilateral lens replacement. Otherwise the orbits are unremarkable. Soft tissues: Negative. CT CERVICAL SPINE FINDINGS Alignment: Grade 1 anterolisthesis of C4 on C5 and C6 on C7. Skull base and vertebrae: Multilevel moderate degenerative changes spine most prominent at the C5-C6 level. No associated severe osseous neural foraminal or central canal stenosis. No acute fracture. No aggressive appearing focal osseous lesion or focal pathologic process. Soft tissues and spinal canal: No prevertebral fluid or swelling. No visible canal hematoma. Upper chest: Unremarkable. Other: None. IMPRESSION: 1. No acute intracranial abnormality. 2.  No acute displaced facial fracture. 3. No acute displaced fracture or traumatic listhesis of the cervical spine. Electronically Signed   By: Morgane  Naveau M.D.   On: 08/29/2023 19:15   CT Maxillofacial Wo Contrast  Result Date: 08/29/2023 CLINICAL DATA:  fall EXAM: CT HEAD WITHOUT CONTRAST CT MAXILLOFACIAL WITHOUT CONTRAST CT CERVICAL SPINE WITHOUT CONTRAST TECHNIQUE: Multidetector CT imaging of the head, cervical spine, and maxillofacial structures were performed using the standard protocol without intravenous contrast. Multiplanar CT image reconstructions of the cervical spine and maxillofacial structures were also generated. RADIATION DOSE REDUCTION: This exam was performed according to the departmental dose-optimization program which includes automated exposure control, adjustment of the mA and/or kV according to patient size and/or use of iterative reconstruction technique. COMPARISON:  None Available. FINDINGS: CT HEAD FINDINGS Brain: Cerebral ventricle sizes are concordant with the degree of cerebral volume loss. Patchy and confluent areas of decreased attenuation are noted throughout the deep and periventricular white matter of the cerebral hemispheres bilaterally, compatible with chronic microvascular ischemic  disease. No evidence of large-territorial acute infarction. No parenchymal hemorrhage. No mass lesion. No extra-axial collection. No mass effect or midline shift. No hydrocephalus. Basilar cisterns are patent. Vascular: No hyperdense vessel. Atherosclerotic calcifications are present within the cavernous internal carotid arteries. Skull: No acute fracture or focal lesion. Other: Right frontal scalp 2 mm hematoma. CT MAXILLOFACIAL FINDINGS Osseous: No fracture or mandibular dislocation. No destructive process. Sinuses/Orbits: Paranasal sinuses and mastoid air cells are clear. Bilateral lens replacement. Otherwise the orbits are unremarkable. Soft tissues: Negative. CT CERVICAL SPINE FINDINGS Alignment: Grade 1 anterolisthesis of C4 on C5 and C6 on C7. Skull base and vertebrae: Multilevel moderate degenerative changes spine most prominent at the C5-C6 level. No associated severe osseous neural foraminal or central canal stenosis. No acute fracture. No aggressive appearing focal osseous lesion or focal pathologic process. Soft tissues and spinal canal: No prevertebral fluid or swelling. No visible canal hematoma. Upper chest: Unremarkable. Other: None. IMPRESSION: 1. No acute intracranial abnormality. 2.  No acute displaced facial fracture. 3. No acute displaced fracture or traumatic listhesis of the cervical spine. Electronically Signed   By: Morgane  Naveau M.D.   On: 08/29/2023 19:15   CT Cervical Spine Wo Contrast Result Date: 08/29/2023 CLINICAL DATA:  fall EXAM: CT HEAD WITHOUT CONTRAST CT MAXILLOFACIAL WITHOUT CONTRAST CT CERVICAL SPINE WITHOUT CONTRAST TECHNIQUE: Multidetector CT imaging of the head, cervical spine, and maxillofacial structures were performed using the standard protocol without intravenous contrast. Multiplanar CT image reconstructions of the cervical spine and maxillofacial structures were also generated. RADIATION DOSE REDUCTION: This exam was performed according to the departmental  dose-optimization program which includes automated exposure control, adjustment of the mA and/or kV according to patient size and/or use of iterative reconstruction technique. COMPARISON:  None Available. FINDINGS: CT HEAD FINDINGS Brain: Cerebral ventricle sizes are concordant with the degree of cerebral volume loss. Patchy and confluent areas of decreased attenuation are noted throughout the deep and periventricular white matter of the cerebral hemispheres bilaterally, compatible with chronic microvascular ischemic disease. No evidence of large-territorial acute infarction. No parenchymal hemorrhage. No mass lesion. No extra-axial collection. No mass effect or midline shift. No hydrocephalus. Basilar cisterns are patent. Vascular: No hyperdense vessel. Atherosclerotic calcifications are present within the cavernous internal carotid arteries. Skull: No acute fracture or focal lesion. Other: Right frontal scalp 2 mm hematoma. CT MAXILLOFACIAL FINDINGS Osseous: No fracture or mandibular dislocation. No destructive process. Sinuses/Orbits: Paranasal sinuses and mastoid air cells are clear. Bilateral lens replacement. Otherwise the orbits are unremarkable. Soft tissues: Negative. CT CERVICAL SPINE FINDINGS Alignment: Grade 1 anterolisthesis of C4 on C5 and C6 on C7. Skull base and vertebrae: Multilevel moderate degenerative changes spine most prominent at the  C5-C6 level. No associated severe osseous neural foraminal or central canal stenosis. No acute fracture. No aggressive appearing focal osseous lesion or focal pathologic process. Soft tissues and spinal canal: No prevertebral fluid or swelling. No visible canal hematoma. Upper chest: Unremarkable. Other: None. IMPRESSION: 1. No acute intracranial abnormality. 2.  No acute displaced facial fracture. 3. No acute displaced fracture or traumatic listhesis of the cervical spine. Electronically Signed   By: Morgane  Naveau M.D.   On: 08/29/2023 19:15     Procedures Procedures    Medications Ordered in ED Medications - No data to display  ED Course/ Medical Decision Making/ A&P                                 Medical Decision Making Amount and/or Complexity of Data Reviewed Radiology: ordered.   This patient presents to the ED for concern of fall, this involves an extensive number of treatment options, and is a complaint that carries with it a high risk of complications and morbidity.   Differential diagnosis includes: SAH, EDH, SDH, fracture, concussion, hematoma, contusion, etc.   Comorbidities  See HPI above   Additional History  Additional history obtained from prior records  Imaging Studies  I ordered imaging studies including CT head, maxillofacial, and cervical spine  I independently visualized and interpreted imaging which showed:  No acute intracranial abnormality. No acute displaced facial fracture. No acute displaced fracture or traumatic listhesis of cervical spine. I agree with the radiologist interpretation   Problem List / ED Course / Critical Interventions / Medication Management  Patient was walking when she tripped and fell, hitting her head on the ground.  No LOC or blood thinner use.  She does have a hematoma to the right side of her forehead and a skin tear to her right elbow.   No headaches, weakness, vision changes, slurred speech, or confusion.  Denies any pain to the back or extremities. I cleaned the abrasion with wound spray and applied bacitracin.  Redressed wound prior to discharge and gave some bacitracin packets for patient use at home. Discussed findings with patient and family at bedside.  All questions answered. Advise close PCP follow-up for reevaluation.  Social Determinants of Health  Physical activity   Test / Admission - Considered  Patient is stable and safe for discharge home. Strict return precautions provided.       Final Clinical Impression(s) / ED  Diagnoses Final diagnoses:  Fall, initial encounter  Hematoma    Rx / DC Orders ED Discharge Orders     None         Sonnie Dusky, PA-C 08/30/23 0014    Scarlette Currier, MD 09/01/23 2321

## 2023-09-06 DIAGNOSIS — S41111A Laceration without foreign body of right upper arm, initial encounter: Secondary | ICD-10-CM | POA: Diagnosis not present

## 2023-09-06 DIAGNOSIS — Z9181 History of falling: Secondary | ICD-10-CM | POA: Diagnosis not present

## 2023-09-06 DIAGNOSIS — R2689 Other abnormalities of gait and mobility: Secondary | ICD-10-CM | POA: Diagnosis not present

## 2023-10-09 DIAGNOSIS — K080 Exfoliation of teeth due to systemic causes: Secondary | ICD-10-CM | POA: Diagnosis not present

## 2023-10-19 DIAGNOSIS — M810 Age-related osteoporosis without current pathological fracture: Secondary | ICD-10-CM | POA: Diagnosis not present

## 2023-10-19 DIAGNOSIS — F418 Other specified anxiety disorders: Secondary | ICD-10-CM | POA: Diagnosis not present

## 2023-10-19 DIAGNOSIS — M1711 Unilateral primary osteoarthritis, right knee: Secondary | ICD-10-CM | POA: Diagnosis not present

## 2023-10-19 DIAGNOSIS — N1831 Chronic kidney disease, stage 3a: Secondary | ICD-10-CM | POA: Diagnosis not present

## 2023-10-19 DIAGNOSIS — E890 Postprocedural hypothyroidism: Secondary | ICD-10-CM | POA: Diagnosis not present

## 2023-11-19 DIAGNOSIS — M1711 Unilateral primary osteoarthritis, right knee: Secondary | ICD-10-CM | POA: Diagnosis not present

## 2023-11-19 DIAGNOSIS — N1831 Chronic kidney disease, stage 3a: Secondary | ICD-10-CM | POA: Diagnosis not present

## 2023-11-19 DIAGNOSIS — E890 Postprocedural hypothyroidism: Secondary | ICD-10-CM | POA: Diagnosis not present

## 2023-11-19 DIAGNOSIS — M810 Age-related osteoporosis without current pathological fracture: Secondary | ICD-10-CM | POA: Diagnosis not present

## 2023-11-19 DIAGNOSIS — F418 Other specified anxiety disorders: Secondary | ICD-10-CM | POA: Diagnosis not present

## 2023-12-07 DIAGNOSIS — H53123 Transient visual loss, bilateral: Secondary | ICD-10-CM | POA: Diagnosis not present

## 2023-12-07 DIAGNOSIS — H43811 Vitreous degeneration, right eye: Secondary | ICD-10-CM | POA: Diagnosis not present

## 2023-12-07 DIAGNOSIS — H401131 Primary open-angle glaucoma, bilateral, mild stage: Secondary | ICD-10-CM | POA: Diagnosis not present

## 2023-12-07 DIAGNOSIS — D3131 Benign neoplasm of right choroid: Secondary | ICD-10-CM | POA: Diagnosis not present

## 2023-12-11 DIAGNOSIS — R1084 Generalized abdominal pain: Secondary | ICD-10-CM | POA: Diagnosis not present

## 2023-12-11 DIAGNOSIS — H547 Unspecified visual loss: Secondary | ICD-10-CM | POA: Diagnosis not present

## 2023-12-14 ENCOUNTER — Other Ambulatory Visit (HOSPITAL_COMMUNITY): Payer: Self-pay | Admitting: Internal Medicine

## 2023-12-14 ENCOUNTER — Ambulatory Visit (HOSPITAL_BASED_OUTPATIENT_CLINIC_OR_DEPARTMENT_OTHER)
Admission: RE | Admit: 2023-12-14 | Discharge: 2023-12-14 | Disposition: A | Discharge status: Home / Self Care | Source: Ambulatory Visit | Attending: Internal Medicine | Admitting: Internal Medicine

## 2023-12-14 ENCOUNTER — Encounter (HOSPITAL_COMMUNITY): Payer: Self-pay | Admitting: Internal Medicine

## 2023-12-14 DIAGNOSIS — N3289 Other specified disorders of bladder: Secondary | ICD-10-CM | POA: Diagnosis not present

## 2023-12-14 DIAGNOSIS — R1084 Generalized abdominal pain: Secondary | ICD-10-CM

## 2023-12-14 DIAGNOSIS — H547 Unspecified visual loss: Secondary | ICD-10-CM

## 2023-12-14 DIAGNOSIS — K5730 Diverticulosis of large intestine without perforation or abscess without bleeding: Secondary | ICD-10-CM | POA: Diagnosis not present

## 2023-12-14 MED ORDER — IOHEXOL 300 MG/ML  SOLN
75.0000 mL | Freq: Once | INTRAMUSCULAR | Status: AC | PRN
  Administered 2023-12-14: 75 mL via INTRAVENOUS

## 2023-12-19 DIAGNOSIS — F418 Other specified anxiety disorders: Secondary | ICD-10-CM | POA: Diagnosis not present

## 2023-12-19 DIAGNOSIS — N1831 Chronic kidney disease, stage 3a: Secondary | ICD-10-CM | POA: Diagnosis not present

## 2023-12-19 DIAGNOSIS — E890 Postprocedural hypothyroidism: Secondary | ICD-10-CM | POA: Diagnosis not present

## 2023-12-19 DIAGNOSIS — M810 Age-related osteoporosis without current pathological fracture: Secondary | ICD-10-CM | POA: Diagnosis not present

## 2023-12-19 DIAGNOSIS — M1711 Unilateral primary osteoarthritis, right knee: Secondary | ICD-10-CM | POA: Diagnosis not present

## 2023-12-20 ENCOUNTER — Ambulatory Visit (HOSPITAL_COMMUNITY)
Admission: RE | Admit: 2023-12-20 | Discharge: 2023-12-20 | Disposition: A | Discharge status: Home / Self Care | Source: Ambulatory Visit | Attending: Internal Medicine | Admitting: Internal Medicine

## 2023-12-20 DIAGNOSIS — H547 Unspecified visual loss: Secondary | ICD-10-CM

## 2023-12-20 DIAGNOSIS — H53439 Sector or arcuate defects, unspecified eye: Secondary | ICD-10-CM | POA: Diagnosis not present

## 2023-12-20 DIAGNOSIS — G319 Degenerative disease of nervous system, unspecified: Secondary | ICD-10-CM | POA: Diagnosis not present

## 2023-12-28 DIAGNOSIS — L570 Actinic keratosis: Secondary | ICD-10-CM | POA: Diagnosis not present

## 2023-12-28 DIAGNOSIS — Z85828 Personal history of other malignant neoplasm of skin: Secondary | ICD-10-CM | POA: Diagnosis not present

## 2023-12-28 DIAGNOSIS — L814 Other melanin hyperpigmentation: Secondary | ICD-10-CM | POA: Diagnosis not present

## 2023-12-28 DIAGNOSIS — D0472 Carcinoma in situ of skin of left lower limb, including hip: Secondary | ICD-10-CM | POA: Diagnosis not present

## 2023-12-28 DIAGNOSIS — L821 Other seborrheic keratosis: Secondary | ICD-10-CM | POA: Diagnosis not present

## 2024-01-11 DIAGNOSIS — H04123 Dry eye syndrome of bilateral lacrimal glands: Secondary | ICD-10-CM | POA: Diagnosis not present

## 2024-01-11 DIAGNOSIS — H53123 Transient visual loss, bilateral: Secondary | ICD-10-CM | POA: Diagnosis not present

## 2024-01-11 DIAGNOSIS — H401131 Primary open-angle glaucoma, bilateral, mild stage: Secondary | ICD-10-CM | POA: Diagnosis not present

## 2024-01-19 DIAGNOSIS — N1831 Chronic kidney disease, stage 3a: Secondary | ICD-10-CM | POA: Diagnosis not present

## 2024-01-19 DIAGNOSIS — M810 Age-related osteoporosis without current pathological fracture: Secondary | ICD-10-CM | POA: Diagnosis not present

## 2024-01-19 DIAGNOSIS — M1711 Unilateral primary osteoarthritis, right knee: Secondary | ICD-10-CM | POA: Diagnosis not present

## 2024-01-19 DIAGNOSIS — E890 Postprocedural hypothyroidism: Secondary | ICD-10-CM | POA: Diagnosis not present

## 2024-01-19 DIAGNOSIS — F418 Other specified anxiety disorders: Secondary | ICD-10-CM | POA: Diagnosis not present

## 2024-02-18 DIAGNOSIS — N1831 Chronic kidney disease, stage 3a: Secondary | ICD-10-CM | POA: Diagnosis not present

## 2024-02-18 DIAGNOSIS — F418 Other specified anxiety disorders: Secondary | ICD-10-CM | POA: Diagnosis not present

## 2024-02-18 DIAGNOSIS — E890 Postprocedural hypothyroidism: Secondary | ICD-10-CM | POA: Diagnosis not present

## 2024-02-18 DIAGNOSIS — M810 Age-related osteoporosis without current pathological fracture: Secondary | ICD-10-CM | POA: Diagnosis not present

## 2024-02-18 DIAGNOSIS — M1711 Unilateral primary osteoarthritis, right knee: Secondary | ICD-10-CM | POA: Diagnosis not present

## 2024-02-20 DIAGNOSIS — M25561 Pain in right knee: Secondary | ICD-10-CM | POA: Diagnosis not present

## 2024-02-20 DIAGNOSIS — M5459 Other low back pain: Secondary | ICD-10-CM | POA: Diagnosis not present

## 2024-02-20 DIAGNOSIS — R2681 Unsteadiness on feet: Secondary | ICD-10-CM | POA: Diagnosis not present

## 2024-02-27 DIAGNOSIS — R2681 Unsteadiness on feet: Secondary | ICD-10-CM | POA: Diagnosis not present

## 2024-02-27 DIAGNOSIS — M5459 Other low back pain: Secondary | ICD-10-CM | POA: Diagnosis not present

## 2024-02-27 DIAGNOSIS — K080 Exfoliation of teeth due to systemic causes: Secondary | ICD-10-CM | POA: Diagnosis not present

## 2024-02-27 DIAGNOSIS — M25561 Pain in right knee: Secondary | ICD-10-CM | POA: Diagnosis not present

## 2024-02-29 DIAGNOSIS — M5459 Other low back pain: Secondary | ICD-10-CM | POA: Diagnosis not present

## 2024-02-29 DIAGNOSIS — M25561 Pain in right knee: Secondary | ICD-10-CM | POA: Diagnosis not present

## 2024-02-29 DIAGNOSIS — R2681 Unsteadiness on feet: Secondary | ICD-10-CM | POA: Diagnosis not present

## 2024-03-05 DIAGNOSIS — M5459 Other low back pain: Secondary | ICD-10-CM | POA: Diagnosis not present

## 2024-03-05 DIAGNOSIS — R2681 Unsteadiness on feet: Secondary | ICD-10-CM | POA: Diagnosis not present

## 2024-03-05 DIAGNOSIS — M25561 Pain in right knee: Secondary | ICD-10-CM | POA: Diagnosis not present

## 2024-03-08 DIAGNOSIS — M25561 Pain in right knee: Secondary | ICD-10-CM | POA: Diagnosis not present

## 2024-03-08 DIAGNOSIS — M5459 Other low back pain: Secondary | ICD-10-CM | POA: Diagnosis not present

## 2024-03-08 DIAGNOSIS — R2681 Unsteadiness on feet: Secondary | ICD-10-CM | POA: Diagnosis not present
